# Patient Record
Sex: Female | Born: 1975 | Race: White | Hispanic: No | Marital: Married | Smoking: Never smoker
Health system: Southern US, Community
[De-identification: ages and names within clinical notes are randomized; demographics above are authoritative.]

## PROBLEM LIST (undated history)

## (undated) DIAGNOSIS — C73 Malignant neoplasm of thyroid gland: Secondary | ICD-10-CM

## (undated) DIAGNOSIS — I10 Essential (primary) hypertension: Secondary | ICD-10-CM

## (undated) DIAGNOSIS — J302 Other seasonal allergic rhinitis: Secondary | ICD-10-CM

## (undated) DIAGNOSIS — I2699 Other pulmonary embolism without acute cor pulmonale: Secondary | ICD-10-CM

## (undated) DIAGNOSIS — I82409 Acute embolism and thrombosis of unspecified deep veins of unspecified lower extremity: Secondary | ICD-10-CM

## (undated) DIAGNOSIS — F411 Generalized anxiety disorder: Secondary | ICD-10-CM

## (undated) DIAGNOSIS — D689 Coagulation defect, unspecified: Secondary | ICD-10-CM

## (undated) DIAGNOSIS — K219 Gastro-esophageal reflux disease without esophagitis: Secondary | ICD-10-CM

## (undated) DIAGNOSIS — R011 Cardiac murmur, unspecified: Secondary | ICD-10-CM

## (undated) HISTORY — PX: CHOLECYSTECTOMY: SHX55

## (undated) HISTORY — DX: Cardiac murmur, unspecified: R01.1

## (undated) HISTORY — DX: Coagulation defect, unspecified: D68.9

## (undated) HISTORY — DX: Malignant neoplasm of thyroid gland: C73

## (undated) HISTORY — DX: Essential (primary) hypertension: I10

## (undated) HISTORY — PX: BIOPSY THYROID: PRO38

---
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
## 2012-05-05 DIAGNOSIS — I2699 Other pulmonary embolism without acute cor pulmonale: Secondary | ICD-10-CM

## 2012-05-05 HISTORY — DX: Other pulmonary embolism without acute cor pulmonale: I26.99

## 2012-11-05 ENCOUNTER — Emergency Department (HOSPITAL_COMMUNITY)
Admission: EM | Admit: 2012-11-05 | Discharge: 2012-11-05 | Disposition: A | Discharge status: Home / Self Care | Payer: 59 | Attending: Emergency Medicine | Admitting: Emergency Medicine

## 2012-11-05 ENCOUNTER — Encounter (HOSPITAL_COMMUNITY): Payer: Self-pay | Admitting: Emergency Medicine

## 2012-11-05 ENCOUNTER — Emergency Department (HOSPITAL_COMMUNITY): Payer: 59

## 2012-11-05 DIAGNOSIS — Y9289 Other specified places as the place of occurrence of the external cause: Secondary | ICD-10-CM | POA: Insufficient documentation

## 2012-11-05 DIAGNOSIS — Y9389 Activity, other specified: Secondary | ICD-10-CM | POA: Insufficient documentation

## 2012-11-05 DIAGNOSIS — R209 Unspecified disturbances of skin sensation: Secondary | ICD-10-CM | POA: Insufficient documentation

## 2012-11-05 DIAGNOSIS — Z880 Allergy status to penicillin: Secondary | ICD-10-CM | POA: Insufficient documentation

## 2012-11-05 DIAGNOSIS — X500XXA Overexertion from strenuous movement or load, initial encounter: Secondary | ICD-10-CM | POA: Insufficient documentation

## 2012-11-05 DIAGNOSIS — S93409A Sprain of unspecified ligament of unspecified ankle, initial encounter: Secondary | ICD-10-CM | POA: Insufficient documentation

## 2012-11-05 MED ORDER — HYDROCODONE-ACETAMINOPHEN 5-325 MG PO TABS: 1.0000 | ORAL_TABLET | ORAL | Status: DC | PRN

## 2012-11-05 NOTE — ED Provider Notes (Signed)
Medical screening examination/treatment/procedure(s) were performed by non-physician practitioner and as supervising physician I was immediately available for consultation/collaboration.  [name], MD 11/05/12 [phone]

## 2012-11-05 NOTE — ED Notes (Signed)
Pt c/o ankle pain. Pt rolled ankle in a hole. Pain 5/10

## 2012-11-05 NOTE — ED Provider Notes (Signed)
History  This chart was scribed for non-physician practitioner working with Amy Chick, MD by Amy Mccall, ED scribe. This patient was seen in room WTR2/[facility]PT2 and the patient's care was started at 6:07 PM.  CSN: [id number] Arrival date & time 11/05/12  1731   Chief Complaint  Patient presents with  . Ankle Pain   The history is provided by the patient. No language interpreter was used.    HPI Comments: Amy Mccall is a 37 y.o. female who presents to the Emergency Department complaining of left ankle pain that started one hour ago. Pt states she stepped in a hole and twisted her ankle. She rates her pain 5/10. Pt states she is not able to ambulate. Pt denies hitting her head or LOC. Pt states she has some numbness in her little toes. She states she does not need pain medication right now.  History reviewed. No pertinent past medical history. History reviewed. No pertinent past surgical history. No family history on file. History  Substance Use Topics  . Smoking status: Never Smoker   . Smokeless tobacco: Not on file  . Alcohol Use: No   OB History   Grav Para Term Preterm Abortions TAB SAB Ect Mult Living                 Review of Systems  Musculoskeletal: Positive for arthralgias.  Neurological: Negative for syncope.  All other systems reviewed and are negative.    Allergies  Sulfa antibiotics and Penicillins  Home Medications   Current Outpatient Rx  Name  Route  Sig  Dispense  Refill  . cetirizine (ZYRTEC) 10 MG tablet   Oral   Take 10 mg by mouth daily as needed for allergies.         Marland Kitchen HYDROcodone-acetaminophen (NORCO/VICODIN) 5-325 MG per tablet   Oral   Take 1 tablet by mouth every 4 (four) hours as needed for pain.   15 tablet   0   . ibuprofen (ADVIL,MOTRIN) 200 MG tablet   Oral   Take 600 mg by mouth every 8 (eight) hours as needed for pain.         Marland Kitchen levonorgestrel-ethinyl estradiol (NORDETTE) 0.15-30 MG-MCG tablet   Oral   Take 1 tablet  by mouth daily.         Marland Kitchen triamcinolone (NASACORT) 55 MCG/ACT nasal inhaler   Nasal   Place 2 sprays into the nose daily.           BP 132/83  Pulse 84  Temp(Src) 98.6 F (37 C) (Oral)  Resp 18  Ht 5\' 6"  (1.676 m)  Wt 205 lb (92.987 kg)  BMI 33.1 kg/m2  SpO2 100%  LMP 10/11/2012  Physical Exam  Nursing note and vitals reviewed. Constitutional: She is oriented to person, place, and time. She appears well-developed and well-nourished. No distress.  HENT:  Head: Normocephalic and atraumatic.  Eyes: EOM are normal.  Neck: Neck supple. No tracheal deviation present.  Cardiovascular: Normal rate.   Pulmonary/Chest: Effort normal. No respiratory distress.  Musculoskeletal:       Left ankle: She exhibits decreased range of motion (due to pain), swelling and ecchymosis. She exhibits no deformity, no laceration and normal pulse. Tenderness. Lateral malleolus tenderness found. Achilles tendon normal.  Neurological: She is alert and oriented to person, place, and time.  Skin: Skin is warm and dry.  Psychiatric: She has a normal mood and affect. Her behavior is normal.    ED Course  Procedures (including  critical care time)  DIAGNOSTIC STUDIES: Oxygen Saturation is 100% on RA, normal by my interpretation.    COORDINATION OF CARE: 6:28 PM-Discussed treatment plan which includes xray with pt at bedside and pt agreed to plan. Advised pt to take ibuprofen at home for pain.  Labs Reviewed - No data to display Dg Ankle Complete Left  11/05/2012   *RADIOLOGY REPORT*  Clinical Data: Twisted left ankle, left ankle pain, injury  LEFT ANKLE COMPLETE - 3+ VIEW  Comparison: None  Findings: Soft tissue swelling laterally. Osseous mineralization normal. Ankle mortise intact. No acute fracture, dislocation or bone destruction.  IMPRESSION: No acute osseous abnormalities.   Original Report Authenticated By: Amy Mccall, M.D.   1. Ankle sprain and strain, left, initial encounter     MDM   Crutches,  ASO, ortho f/u. Rx pain medication for break through pain. Advised to follow R.I.C.E and use Ibuprofen for pain.    37 y.o.Amy Mccall's evaluation in the Emergency Department is complete. It has been determined that no acute conditions requiring further emergency intervention are present at this time. The patient/guardian have been advised of the diagnosis and plan. We have discussed signs and symptoms that warrant return to the ED, such as changes or worsening in symptoms.  Vital signs are stable at discharge. Filed Vitals:   11/05/12 1809  BP: 132/83  Pulse: 84  Temp: 98.6 F (37 C)  Resp: 18    Patient/guardian has voiced understanding and agreed to follow-up with the PCP or specialist.   I personally performed the services described in this documentation, which was scribed in my presence. The recorded information has been reviewed and is accurate.   Amy Matas, PA-C 11/05/12 1845

## 2012-12-09 ENCOUNTER — Emergency Department (HOSPITAL_BASED_OUTPATIENT_CLINIC_OR_DEPARTMENT_OTHER)
Admission: EM | Admit: 2012-12-09 | Discharge: 2012-12-09 | Disposition: A | Discharge status: Home / Self Care | Payer: 59 | Attending: Emergency Medicine | Admitting: Emergency Medicine

## 2012-12-09 ENCOUNTER — Encounter (HOSPITAL_BASED_OUTPATIENT_CLINIC_OR_DEPARTMENT_OTHER): Payer: Self-pay | Admitting: *Deleted

## 2012-12-09 DIAGNOSIS — M7989 Other specified soft tissue disorders: Secondary | ICD-10-CM | POA: Insufficient documentation

## 2012-12-09 DIAGNOSIS — I82402 Acute embolism and thrombosis of unspecified deep veins of left lower extremity: Secondary | ICD-10-CM

## 2012-12-09 DIAGNOSIS — Z79899 Other long term (current) drug therapy: Secondary | ICD-10-CM | POA: Insufficient documentation

## 2012-12-09 DIAGNOSIS — I82409 Acute embolism and thrombosis of unspecified deep veins of unspecified lower extremity: Secondary | ICD-10-CM | POA: Insufficient documentation

## 2012-12-09 DIAGNOSIS — Z880 Allergy status to penicillin: Secondary | ICD-10-CM | POA: Insufficient documentation

## 2012-12-09 DIAGNOSIS — IMO0001 Reserved for inherently not codable concepts without codable children: Secondary | ICD-10-CM | POA: Insufficient documentation

## 2012-12-09 LAB — CBC WITH DIFFERENTIAL/PLATELET
Basophils Absolute: 0 10*3/uL (ref 0.0–0.1)
Basophils Relative: 0 % (ref 0–1)
Eosinophils Absolute: 0.1 10*3/uL (ref 0.0–0.7)
HCT: 39 % (ref 36.0–46.0)
Hemoglobin: 13.3 g/dL (ref 12.0–15.0)
MCH: 30 pg (ref 26.0–34.0)
MCHC: 34.1 g/dL (ref 30.0–36.0)
Monocytes Absolute: 1 10*3/uL (ref 0.1–1.0)
Monocytes Relative: 9 % (ref 3–12)
RDW: 12.4 % (ref 11.5–15.5)

## 2012-12-09 LAB — PROTIME-INR
INR: 1.02 (ref 0.00–1.49)
Prothrombin Time: 13.2 seconds (ref 11.6–15.2)

## 2012-12-09 LAB — COMPREHENSIVE METABOLIC PANEL
Albumin: 3.9 g/dL (ref 3.5–5.2)
BUN: 9 mg/dL (ref 6–23)
Calcium: 10.2 mg/dL (ref 8.4–10.5)
Creatinine, Ser: 0.8 mg/dL (ref 0.50–1.10)
Total Protein: 7.7 g/dL (ref 6.0–8.3)

## 2012-12-09 LAB — HCG, SERUM, QUALITATIVE: Preg, Serum: NEGATIVE

## 2012-12-09 LAB — APTT: aPTT: 29 seconds (ref 24–37)

## 2012-12-09 MED ORDER — ENOXAPARIN SODIUM 100 MG/ML ~~LOC~~ SOLN: 1.0000 mg/kg | Freq: Two times a day (BID) | SUBCUTANEOUS | Status: DC

## 2012-12-09 MED ORDER — WARFARIN SODIUM 5 MG PO TABS
5.0000 mg | ORAL_TABLET | Freq: Once | ORAL | Status: AC
  Administered 2012-12-09: 5 mg via ORAL
  Filled 2012-12-09: qty 1

## 2012-12-09 MED ORDER — ENOXAPARIN SODIUM 100 MG/ML ~~LOC~~ SOLN
1.0000 mg/kg | Freq: Once | SUBCUTANEOUS | Status: AC
  Administered 2012-12-09: 95 mg via SUBCUTANEOUS
  Filled 2012-12-09: qty 1

## 2012-12-09 MED ORDER — TRAMADOL HCL 50 MG PO TABS: 50.0000 mg | ORAL_TABLET | Freq: Four times a day (QID) | ORAL | Status: DC | PRN

## 2012-12-09 MED ORDER — WARFARIN SODIUM 5 MG PO TABS: 5.0000 mg | ORAL_TABLET | Freq: Every day | ORAL | Status: DC

## 2012-12-09 NOTE — ED Notes (Signed)
Pt w/left calf pain x 2 weeks seen by PMD US done today with DX DVT.

## 2012-12-09 NOTE — ED Notes (Signed)
MD at bedside. 

## 2012-12-09 NOTE — ED Provider Notes (Signed)
CSN: [id number]     Arrival date & time 12/09/12  1742 History     First MD Initiated Contact with Patient 12/09/12 1749     Chief Complaint  Patient presents with  . DVT   (Consider location/radiation/quality/duration/timing/severity/associated sxs/prior Treatment) HPI Pt sprained L ankle 4 weeks ago. She began to have L calf swelling, pain and discoloration 2 weeks ago. No chest pain SOB. Pt's father with history of DVT. Pt is also on oral birth control tabs. Pt had [place] LLE which showed acute DVT in L popliteal vein. Pt continues to deny SOB or chest pain.  History reviewed. No pertinent past medical history. History reviewed. No pertinent past surgical history. History reviewed. No pertinent family history. History  Substance Use Topics  . Smoking status: Never Smoker   . Smokeless tobacco: Not on file  . Alcohol Use: No   OB History   Grav Para Term Preterm Abortions TAB SAB Ect Mult Living                 Review of Systems  Constitutional: Negative for fever and chills.  HENT: Negative for neck pain.   Respiratory: Negative for shortness of breath.   Cardiovascular: Positive for leg swelling. Negative for chest pain and palpitations.  Gastrointestinal: Negative for nausea, vomiting, abdominal pain, diarrhea and constipation.  Musculoskeletal: Positive for myalgias. Negative for back pain.  Skin: Positive for color change. Negative for rash.  Neurological: Negative for dizziness, weakness, light-headedness, numbness and headaches.  All other systems reviewed and are negative.    Allergies  Sulfa antibiotics and Penicillins  Home Medications   Current Outpatient Rx  Name  Route  Sig  Dispense  Refill  . cetirizine (ZYRTEC) 10 MG tablet   Oral   Take 10 mg by mouth daily as needed for allergies.         Marland Kitchen enoxaparin (LOVENOX) 100 MG/ML injection   Subcutaneous   Inject 0.95 mLs (95 mg total) into the skin every 12 (twelve) hours.   20 mL   0   . ibuprofen  (ADVIL,MOTRIN) 200 MG tablet   Oral   Take 600 mg by mouth every 8 (eight) hours as needed for pain.         Marland Kitchen levonorgestrel-ethinyl estradiol (NORDETTE) 0.15-30 MG-MCG tablet   Oral   Take 1 tablet by mouth daily.         . traMADol (ULTRAM) 50 MG tablet   Oral   Take 1 tablet (50 mg total) by mouth every 6 (six) hours as needed for pain.   15 tablet   0   . triamcinolone (NASACORT) 55 MCG/ACT nasal inhaler   Nasal   Place 2 sprays into the nose daily.         Marland Kitchen warfarin (COUMADIN) 5 MG tablet   Oral   Take 1 tablet (5 mg total) by mouth daily.   5 tablet   0    BP 143/87  Pulse 88  Resp 20  Ht 5\' 6"  (1.676 m)  Wt 210 lb (95.255 kg)  BMI 33.91 kg/m2  SpO2 99%  LMP 12/02/2012 Physical Exam  Nursing note and vitals reviewed. Constitutional: She is oriented to person, place, and time. She appears well-developed and well-nourished. No distress.  HENT:  Head: Normocephalic and atraumatic.  Mouth/Throat: Oropharynx is clear and moist.  Eyes: EOM are normal. Pupils are equal, round, and reactive to light.  Neck: Normal range of motion. Neck supple.  Cardiovascular: Normal rate  and regular rhythm.   Pulmonary/Chest: Effort normal and breath sounds normal. No respiratory distress. She has no wheezes. She has no rales. She exhibits no tenderness.  Abdominal: Soft. Bowel sounds are normal. She exhibits no distension and no mass. There is no tenderness. There is no rebound and no guarding.  Musculoskeletal: Normal range of motion. She exhibits edema and tenderness.  Swelling below knee of LLE, with diffuse increased erythema and calf tenderness. 2+DP. Good cap refill  Neurological: She is alert and oriented to person, place, and time.  Sensation intact, 5/5 motor in all ext.   Skin: Skin is warm and dry. No rash noted. There is erythema.  Psychiatric: She has a normal mood and affect. Her behavior is normal.    ED Course   Procedures (including critical care  time)  Labs Reviewed  CBC WITH DIFFERENTIAL - Abnormal; Notable for the following:    WBC 11.1 (*)    Neutro Abs 7.8 (*)    All other components within normal limits  COMPREHENSIVE METABOLIC PANEL  PROTIME-INR  APTT  HCG, SERUM, QUALITATIVE   No results found. 1. Left leg DVT     MDM  Will check basic labs, start lovenox and coumdin and have pt f/u with PMD to check INR and make adjustments.   Loren Racer, MD 12/09/12 [phone]

## 2015-02-06 ENCOUNTER — Other Ambulatory Visit: Payer: Self-pay | Admitting: Otolaryngology

## 2015-02-06 DIAGNOSIS — E041 Nontoxic single thyroid nodule: Secondary | ICD-10-CM

## 2015-05-06 HISTORY — PX: THYROIDECTOMY: SHX17

## 2015-05-24 DIAGNOSIS — J301 Allergic rhinitis due to pollen: Secondary | ICD-10-CM | POA: Insufficient documentation

## 2015-05-24 DIAGNOSIS — F411 Generalized anxiety disorder: Secondary | ICD-10-CM | POA: Insufficient documentation

## 2015-10-28 ENCOUNTER — Emergency Department (HOSPITAL_BASED_OUTPATIENT_CLINIC_OR_DEPARTMENT_OTHER)
Admission: EM | Admit: 2015-10-28 | Discharge: 2015-10-28 | Disposition: A | Discharge status: Home / Self Care | Payer: 59 | Attending: Emergency Medicine | Admitting: Emergency Medicine

## 2015-10-28 ENCOUNTER — Emergency Department (HOSPITAL_BASED_OUTPATIENT_CLINIC_OR_DEPARTMENT_OTHER): Payer: 59

## 2015-10-28 ENCOUNTER — Encounter (HOSPITAL_BASED_OUTPATIENT_CLINIC_OR_DEPARTMENT_OTHER): Payer: Self-pay | Admitting: Emergency Medicine

## 2015-10-28 DIAGNOSIS — S99911A Unspecified injury of right ankle, initial encounter: Secondary | ICD-10-CM

## 2015-10-28 DIAGNOSIS — Z7982 Long term (current) use of aspirin: Secondary | ICD-10-CM | POA: Diagnosis not present

## 2015-10-28 DIAGNOSIS — Z79899 Other long term (current) drug therapy: Secondary | ICD-10-CM | POA: Insufficient documentation

## 2015-10-28 DIAGNOSIS — Y9301 Activity, walking, marching and hiking: Secondary | ICD-10-CM | POA: Diagnosis not present

## 2015-10-28 DIAGNOSIS — Y999 Unspecified external cause status: Secondary | ICD-10-CM | POA: Insufficient documentation

## 2015-10-28 DIAGNOSIS — W010XXA Fall on same level from slipping, tripping and stumbling without subsequent striking against object, initial encounter: Secondary | ICD-10-CM | POA: Insufficient documentation

## 2015-10-28 DIAGNOSIS — S8992XA Unspecified injury of left lower leg, initial encounter: Secondary | ICD-10-CM | POA: Diagnosis present

## 2015-10-28 DIAGNOSIS — Y929 Unspecified place or not applicable: Secondary | ICD-10-CM | POA: Insufficient documentation

## 2015-10-28 DIAGNOSIS — S80212A Abrasion, left knee, initial encounter: Secondary | ICD-10-CM | POA: Diagnosis not present

## 2015-10-28 HISTORY — DX: Generalized anxiety disorder: F41.1

## 2015-10-28 HISTORY — DX: Gastro-esophageal reflux disease without esophagitis: K21.9

## 2015-10-28 HISTORY — DX: Other pulmonary embolism without acute cor pulmonale: I26.99

## 2015-10-28 HISTORY — DX: Other seasonal allergic rhinitis: J30.2

## 2015-10-28 HISTORY — DX: Acute embolism and thrombosis of unspecified deep veins of unspecified lower extremity: I82.409

## 2015-10-28 MED ORDER — IBUPROFEN 400 MG PO TABS
600.0000 mg | ORAL_TABLET | Freq: Once | ORAL | Status: AC
  Administered 2015-10-28: 600 mg via ORAL
  Filled 2015-10-28: qty 1

## 2015-10-28 NOTE — ED Notes (Signed)
PA at bedside.

## 2015-10-28 NOTE — ED Notes (Signed)
Pt reports slipping on wet grass around 10 this morning and turning her ankle. Pt c/o R ankle pain. Swelling noted. Pt denies LOC. Pt also has abrasion noted to L knee.

## 2015-10-28 NOTE — ED Provider Notes (Signed)
CSN: [id number]     Arrival date & time 10/28/15  1016 History   First MD Initiated Contact with Patient 10/28/15 1045     Chief Complaint  Patient presents with  . Ankle Injury     (Consider location/radiation/quality/duration/timing/severity/associated sxs/prior Treatment) HPI   Patient is a 40 year old female with past medical history of anxiety, DVT/PE who presents to the ED status post fall is reported right ankle pain, onset 10 AM. Patient reports she was walking at her son's baseball game when she slipped on wet grass resulting in her inverting her right ankle. Patient reports having pain and swelling to the lateral aspect of her right ankle. Patient denies head injury or LOC. Denies numbness, tingling. Patient also reports having small abrasion to her left anterior knee, no active bleeding. Denies left knee pain. Pt denies taking any medications PTA. She reports she had a DVT and PE 3 years ago and notes she currently takes 81mg  ASA daily. Pt denies any blood disorders and reports her hematologist told her he suspected her DVT was likely due to her ankle brace being wrapped too tightly after her injury.   Past Medical History  Diagnosis Date  . DVT (deep venous thrombosis) ([place])   . PE (pulmonary thromboembolism) ([place])   . Multiple thyroid nodules   . GERD (gastroesophageal reflux disease)   . Generalized anxiety disorder   . Seasonal allergies    Past Surgical History  Procedure Laterality Date  . Cholecystectomy    . Biopsy thyroid     No family history on file. Social History  Substance Use Topics  . Smoking status: Never Smoker   . Smokeless tobacco: None  . Alcohol Use: No   OB History    No data available     Review of Systems  Respiratory: Negative for shortness of breath.   Cardiovascular: Negative for chest pain and leg swelling.  Musculoskeletal: Positive for joint swelling (right ankle) and arthralgias (right ankle).  Skin: Positive for wound (abrasion).    Neurological: Negative for weakness and numbness.      Allergies  Sulfa antibiotics and Penicillins  Home Medications   Prior to Admission medications   Medication Sig Start Date End Date Taking? Authorizing Provider  aspirin EC 81 MG tablet Take 81 mg by mouth daily.   Yes Historical Provider, MD  cholecalciferol (VITAMIN D) 1000 units tablet Take 1,000 Units by mouth daily.   Yes Historical Provider, MD  esomeprazole (NEXIUM) 20 MG capsule Take 20 mg by mouth daily at 12 noon.   Yes Historical Provider, MD  norethindrone (MICRONOR,CAMILA,ERRIN) 0.35 MG tablet Take 1 tablet by mouth daily.   Yes Historical Provider, MD  sertraline (ZOLOFT) 50 MG tablet Take 50 mg by mouth daily.   Yes Historical Provider, MD  cetirizine (ZYRTEC) 10 MG tablet Take 10 mg by mouth daily as needed for allergies.    Historical Provider, MD  triamcinolone (NASACORT) 55 MCG/ACT nasal inhaler Place 2 sprays into the nose daily.    Historical Provider, MD   BP 143/68 mmHg  Pulse 93  Temp(Src) 98.6 F (37 C) (Oral)  Resp 16  Ht 5\' 6"  (1.676 m)  Wt 99.791 kg  BMI 35.53 kg/m2  SpO2 100% Physical Exam  Constitutional: She is oriented to person, place, and time. She appears well-developed and well-nourished. No distress.  HENT:  Head: Normocephalic and atraumatic.  Eyes: Conjunctivae and EOM are normal. Right eye exhibits no discharge. Left eye exhibits no discharge. No scleral  icterus.  Neck: Normal range of motion. Neck supple.  Cardiovascular: Normal rate.   Pulmonary/Chest: Effort normal. No respiratory distress.  Musculoskeletal: Normal range of motion. She exhibits tenderness.       Left knee: She exhibits normal range of motion, no swelling, no effusion, no ecchymosis, no deformity, no laceration, no erythema, normal alignment, no LCL laxity, normal patellar mobility and no MCL laxity. No tenderness found.       Right ankle: She exhibits swelling. She exhibits normal range of motion, no ecchymosis,  no deformity, no laceration and normal pulse. Tenderness. Lateral malleolus tenderness found. Achilles tendon normal.       Legs:      Feet:  FROM of bilateral knees, ankles, feet and toes with 5 out of 5 strength. Sensation grossly intact. No calf pain or swelling. 2+ DP pulses.  Neurological: She is alert and oriented to person, place, and time.  Skin: She is not diaphoretic.  Nursing note and vitals reviewed.   ED Course  Procedures (including critical care time) Labs Review Labs Reviewed - No data to display  Imaging Review Dg Ankle Complete Right  10/28/2015  CLINICAL DATA:  Right ankle pain after injury EXAM: RIGHT ANKLE - COMPLETE 3+ VIEW COMPARISON:  None. FINDINGS: Lateral right ankle soft tissue swelling. No fracture, subluxation or suspicious focal osseous lesion. No appreciable degenerative or erosive arthropathy. No radiopaque foreign body. Tiny plantar right calcaneal spur. IMPRESSION: Lateral right ankle soft tissue swelling, with no fracture or subluxation. Electronically Signed   By: Ilona Sorrel M.D.   On: 10/28/2015 11:24   I have personally reviewed and evaluated these images and lab results as part of my medical decision-making.   EKG Interpretation None      MDM   Final diagnoses:  Ankle injury, right, initial encounter    Patient presents with right ankle pain and swelling after slipping and twisting her ankle earlier this morning. Denies head injury or LOC. History of DVT/PE status post ankle sprain 3 years ago, patient currently on baby aspirin daily, pt is followed by hematologist. VSS. Exam revealed mild swelling and tenderness to right lateral malleolus, remaining exam unremarkable. Bilateral lower extremities neurovascularly intact. No calf swelling, active cancer, recent bed rest/major surgery, leg swelling, pitting edema or tenderness over deep venous system; I do not suspect a DVT at this time and do not feel that any further work up or imaging is  warranted. Ankle xray revealed lateral right ankle soft tissue swelling with no fracture or subluxation. Discussed results and plan for discharge with patient. Patient placed in ankle brace and given crutches in the ED. Discussed symptomatic treatment including RICE protocol with pt. discussed strict return precautions with patient including leg swelling, calf swelling/pain, SOB or CP. Patient reports understanding and agreement. Advised patient to follow up with orthopedist in the next 1-2 weeks as needed.    Chesley Noon [place], [place] 10/28/15 [place], MD 10/28/15 1444

## 2015-10-28 NOTE — Discharge Instructions (Signed)
I recommend taking Tylenol as prescribed over-the-counter as needed for pain relief. I also recommend resting, elevating and applying ice to her right ankle for 15-20 minutes 3-4 times daily to help with pain and swelling. He may continue using your ankle brace over the next few weeks and usually her crutches over the next few days as needed. I recommend refraining from laying in bed all day to prevent DVTs. Follow-up with the orthopedist clinic listed above within the next 1-2 weeks if your symptoms have not improved. Please return to the Emergency Department if symptoms worsen or new onset of fever, redness, swelling, numbness, tingling, weakness, calf swelling, leg swelling, shortness of breath, chest pain.

## 2015-10-28 NOTE — ED Notes (Signed)
Pt fell and injured right ankle.  Abrasion to left knee.  Able to move toes.  Pt concerned about blood clots due to history of DVT and PE post sprain 3 years ago.

## 2016-05-30 DIAGNOSIS — E890 Postprocedural hypothyroidism: Secondary | ICD-10-CM | POA: Diagnosis not present

## 2016-05-30 DIAGNOSIS — Z85850 Personal history of malignant neoplasm of thyroid: Secondary | ICD-10-CM | POA: Diagnosis not present

## 2016-05-30 DIAGNOSIS — C73 Malignant neoplasm of thyroid gland: Secondary | ICD-10-CM | POA: Diagnosis not present

## 2016-07-17 DIAGNOSIS — Z01419 Encounter for gynecological examination (general) (routine) without abnormal findings: Secondary | ICD-10-CM | POA: Diagnosis not present

## 2016-08-04 DIAGNOSIS — Z1231 Encounter for screening mammogram for malignant neoplasm of breast: Secondary | ICD-10-CM | POA: Diagnosis not present

## 2016-10-13 DIAGNOSIS — D235 Other benign neoplasm of skin of trunk: Secondary | ICD-10-CM | POA: Diagnosis not present

## 2016-10-31 DIAGNOSIS — E041 Nontoxic single thyroid nodule: Secondary | ICD-10-CM | POA: Diagnosis not present

## 2016-10-31 DIAGNOSIS — E890 Postprocedural hypothyroidism: Secondary | ICD-10-CM | POA: Diagnosis not present

## 2016-10-31 DIAGNOSIS — C73 Malignant neoplasm of thyroid gland: Secondary | ICD-10-CM | POA: Diagnosis not present

## 2016-10-31 DIAGNOSIS — E063 Autoimmune thyroiditis: Secondary | ICD-10-CM | POA: Diagnosis not present

## 2016-10-31 DIAGNOSIS — Z08 Encounter for follow-up examination after completed treatment for malignant neoplasm: Secondary | ICD-10-CM | POA: Diagnosis not present

## 2016-11-04 ENCOUNTER — Telehealth: Payer: Self-pay

## 2016-11-04 NOTE — Telephone Encounter (Signed)
Pre Visit Call completed today

## 2016-11-06 ENCOUNTER — Ambulatory Visit (INDEPENDENT_AMBULATORY_CARE_PROVIDER_SITE_OTHER): Payer: 59 | Admitting: Family Medicine

## 2016-11-06 ENCOUNTER — Encounter: Payer: Self-pay | Admitting: Family Medicine

## 2016-11-06 VITALS — BP 122/72 | HR 94 | Temp 98.6°F | Ht 67.0 in | Wt 233.0 lb

## 2016-11-06 DIAGNOSIS — E669 Obesity, unspecified: Secondary | ICD-10-CM | POA: Diagnosis not present

## 2016-11-06 DIAGNOSIS — R011 Cardiac murmur, unspecified: Secondary | ICD-10-CM

## 2016-11-06 NOTE — Progress Notes (Signed)
Chief Complaint  Patient presents with  . Establish Care       New Patient Visit SUBJECTIVE: HPI: Amy Mccall is an 41 y.o.female who is being seen for establishing care.  The patient was previously seen at [facility].  The patient is mainly here to see if she would like to start coming here. She would like to review her medical history and discuss weight loss options. She has gained around 50 pounds over the past several years. She no she needs to get back in the gym including a diet. She is wondering if there is an ideal way to do the latter.  Allergies  Allergen Reactions  . 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 Penicillins     Childhood allergy   Past Medical History:  Diagnosis Date  . DVT (deep venous thrombosis) ([place]) 2014   turned into PE  . Generalized anxiety disorder    Follows with a Social worker  . GERD (gastroesophageal reflux disease)   . PE (pulmonary thromboembolism) ([place]) 2014  . Seasonal allergies   . Thyroid cancer [facility])    Removed thyroid in Aug, 2017, radiation in October   Past Surgical History:  Procedure Laterality Date  . BIOPSY THYROID    . CHOLECYSTECTOMY    . THYROIDECTOMY  2017   Social History   Social History  . Marital status: Married   Social History Main Topics  . Smoking status: Never Smoker  . Smokeless tobacco: Never Used  . Alcohol use No  . Drug use: No  . Sexual activity: No   Other Topics Concern  . Not on file   Social History Narrative  . No narrative on file   Family History  Problem Relation Age of Onset  . Osteoporosis Mother   . COPD Father   . Diabetes Father   . Heart disease Father   . Colon cancer Neg Hx     Current Outpatient Prescriptions:  .  aspirin EC 81 MG tablet, Take 81 mg by mouth daily., Disp: , Rfl:  .  cetirizine (ZYRTEC) 10 MG tablet, Take 10 mg by mouth daily as needed for allergies., Disp: , Rfl:  .  cholecalciferol (VITAMIN D) 1000 units tablet, Take 1,000 Units  by mouth daily., Disp: , Rfl:  .  esomeprazole (NEXIUM) 20 MG capsule, Take 20 mg by mouth daily at 12 noon., Disp: , Rfl:  .  levothyroxine (SYNTHROID, LEVOTHROID) 150 MCG tablet, Take 150 mcg by mouth daily before breakfast. , Disp: , Rfl:  .  norethindrone (MICRONOR,CAMILA,ERRIN) 0.35 MG tablet, Take 1 tablet by mouth daily., Disp: , Rfl:   No LMP recorded. Patient is not currently having periods (Reason: Oral contraceptives).  ROS Cardiovascular: Denies chest pain, +palpitations  Respiratory: Denies dyspnea   OBJECTIVE: BP 122/72 (BP Location: Left Arm, Patient Position: Sitting, Cuff Size: Large)   Pulse 94   Temp 98.6 F (37 C) (Oral)   Ht 5\' 7"  (1.702 m)   Wt 233 lb (105.7 kg)   SpO2 98%   BMI 36.49 kg/m   Constitutional: -  VS reviewed -  Well developed, well nourished, appears stated age -  No apparent distress  Psychiatric: -  Oriented to person, place, and time -  Memory intact -  Affect and mood normal -  Fluent conversation, good eye contact -  Judgment and insight age appropriate  Eye: -  Conjunctivae clear, no discharge -  Pupils symmetric, round, reactive to light  ENMT: -  Oral mucosa without lesions, tongue and uvula midline    Tonsils not enlarged, no erythema, no exudate, trachea midline    Pharynx moist, no lesions, no erythema  Neck: -  No gross swelling, no palpable masses -  Thyroid midline, not enlarged, mobile, no palpable masses  Cardiovascular: -  RRR, 2/6 SEM heard loudest at tricuspid listening post -  No bruits, no LE edema  Respiratory: -  Normal respiratory effort, no accessory muscle use, no retraction -  Breath sounds equal, no wheezes, no ronchi, no crackles  Skin: -  No significant lesion on inspection -  Warm and dry to palpation   ASSESSMENT/PLAN: Heart murmur - Plan: ECHOCARDIOGRAM COMPLETE  Obesity (BMI 30-39.9)  Patient instructed to sign release of records form from her previous PCP. Pt not having symptoms regarding her  heart murmur. We'll obtain an echocardiogram. She is working closely with her endocrinologist regarding her palpitations which may be due to Synthroid. I do not think these are related. Discussed intermittent fasting- recommended 16/8. Counseled on diet and exercise. Healthy diet handout given. Patient should return prn. The patient voiced understanding and agreement to the plan.  Greater than 30 minutes were spent face to face with the patient with greater than 50% of this time spent counseling on heart murmurs, echocardiogram, follow up for this, and intermittent fasting.    [place], DO 11/06/16  11:35 AM

## 2016-11-06 NOTE — Patient Instructions (Addendum)
Consider intermittent fasting. I would recommend going 16 hours without calories and getting your energy in an 8 hour window. You may be hungry in the early goings. This decreases with time.  I would do this 2-3 times per week. I do it every day, but I don't have great data for this.   If you do not hear anything about your echo in the next week, call our office and ask for an update.  Healthy Eating Plan Many factors influence your heart health, including eating and exercise habits. Heart (coronary) risk increases with abnormal blood fat (lipid) levels. Heart-healthy meal planning includes limiting unhealthy fats, increasing healthy fats, and making other small dietary changes. This includes maintaining a healthy body weight to help keep lipid levels within a normal range.  WHAT IS MY PLAN?  Your health care provider recommends that you:  Drink a glass of water before meals to help with satiety.  Eat slowly.  An alternative to the water is to add Metamucil. This will help with satiety as well. It does contain calories, unlike water.  WHAT TYPES OF FAT SHOULD I CHOOSE?  Choose healthy fats more often. Choose monounsaturated and polyunsaturated fats, such as olive oil and canola oil, flaxseeds, walnuts, almonds, and seeds.  Eat more omega-3 fats. Good choices include salmon, mackerel, sardines, tuna, flaxseed oil, and ground flaxseeds. Aim to eat fish at least two times each week.  Avoid foods with partially hydrogenated oils in them. These contain trans fats. Examples of foods that contain trans fats are stick margarine, some tub margarines, cookies, crackers, and other baked goods. If you are going to avoid a fat, this is the one to avoid!  WHAT GENERAL GUIDELINES DO I NEED TO FOLLOW?  Check food labels carefully to identify foods with trans fats. Avoid these types of options when possible.  Fill one half of your plate with vegetables and green salads. Eat 4-5 servings of vegetables per  day. A serving of vegetables equals 1 cup of raw leafy vegetables,  cup of raw or cooked cut-up vegetables, or  cup of vegetable juice.  Fill one fourth of your plate with whole grains. Look for the word "whole" as the first word in the ingredient list.  Fill one fourth of your plate with lean protein foods.  Eat 4-5 servings of fruit per day. A serving of fruit equals one medium whole fruit,  cup of dried fruit,  cup of fresh, frozen, or canned fruit. Try to avoid fruits in cups/syrups as the sugar content can be high.  Eat more foods that contain soluble fiber. Examples of foods that contain this type of fiber are apples, broccoli, carrots, beans, peas, and barley. Aim to get 20-30 g of fiber per day.  Eat more home-cooked food and less restaurant, buffet, and fast food.  Limit or avoid alcohol.  Limit foods that are high in starch and sugar.  Avoid fried foods when able.  Cook foods by using methods other than frying. Baking, boiling, grilling, and broiling are all great options. Other fat-reducing suggestions include: ? Removing the skin from poultry. ? Removing all visible fats from meats. ? Skimming the fat off of stews, soups, and gravies before serving them. ? Steaming vegetables in water or broth.  Lose weight if you are overweight. Losing just 5-10% of your initial body weight can help your overall health and prevent diseases such as diabetes and heart disease.  Increase your consumption of nuts, legumes, and seeds to 4-5  servings per week. One serving of dried beans or legumes equals  cup after being cooked, one serving of nuts equals 1 ounces, and one serving of seeds equals  ounce or 1 tablespoon.  WHAT ARE GOOD FOODS CAN I EAT? Grains Grainy breads (try to find bread that is 3 g of fiber per slice or greater), oatmeal, light popcorn. Whole-grain cereals. Rice and pasta, including brown rice and those that are made with whole wheat. Edamame pasta is a great  alternative to grain pasta. It has a higher protein content. Try to avoid significant consumption of white bread, sugary cereals, or pastries/baked goods.  Vegetables All vegetables. Cooked white potatoes do not count as vegetables.  Fruits All fruits, but limit pineapple and bananas as these fruits have a higher sugar content.  Meats and Other Protein Sources Lean, well-trimmed beef, veal, pork, and lamb. Chicken and [place] without skin. All fish and shellfish. Wild duck, rabbit, pheasant, and venison. Egg whites or low-cholesterol egg substitutes. Dried beans, peas, lentils, and tofu.Seeds and most nuts.  Dairy Low-fat or nonfat cheeses, including ricotta, string, and mozzarella. Skim or 1% milk that is liquid, powdered, or evaporated. Buttermilk that is made with low-fat milk. Nonfat or low-fat yogurt. Soy/Almond milk are good alternatives if you cannot handle dairy.  Beverages Water is the best for you. Sports drinks with less sugar are more desirable unless you are a highly active athlete.  Sweets and Desserts Sherbets and fruit ices. Honey, jam, marmalade, jelly, and syrups. Dark chocolate.  Eat all sweets and desserts in moderation.  Fats and Oils Nonhydrogenated (trans-free) margarines. Vegetable oils, including soybean, sesame, sunflower, olive, peanut, safflower, corn, canola, and cottonseed. Salad dressings or mayonnaise that are made with a vegetable oil. Limit added fats and oils that you use for cooking, baking, salads, and as spreads.  Other Cocoa powder. Coffee and tea. Most condiments.  The items listed above may not be a complete list of recommended foods or beverages. Contact your dietitian for more options.

## 2016-11-06 NOTE — Progress Notes (Signed)
Pre visit review using our clinic review tool, if applicable. No additional management support is needed unless otherwise documented below in the visit note. 

## 2016-11-12 ENCOUNTER — Telehealth: Payer: Self-pay | Admitting: *Deleted

## 2016-11-12 NOTE — Telephone Encounter (Signed)
Received Medical records from [facility], forwarded to provider/SLS 07/11

## 2016-11-20 ENCOUNTER — Other Ambulatory Visit (HOSPITAL_BASED_OUTPATIENT_CLINIC_OR_DEPARTMENT_OTHER): Payer: 59

## 2016-11-23 ENCOUNTER — Emergency Department (HOSPITAL_BASED_OUTPATIENT_CLINIC_OR_DEPARTMENT_OTHER)
Admission: EM | Admit: 2016-11-23 | Discharge: 2016-11-23 | Disposition: A | Discharge status: Home / Self Care | Payer: 59 | Attending: Emergency Medicine | Admitting: Emergency Medicine

## 2016-11-23 ENCOUNTER — Encounter (HOSPITAL_BASED_OUTPATIENT_CLINIC_OR_DEPARTMENT_OTHER): Payer: Self-pay | Admitting: Emergency Medicine

## 2016-11-23 ENCOUNTER — Emergency Department (HOSPITAL_BASED_OUTPATIENT_CLINIC_OR_DEPARTMENT_OTHER): Payer: 59

## 2016-11-23 DIAGNOSIS — R0602 Shortness of breath: Secondary | ICD-10-CM | POA: Diagnosis not present

## 2016-11-23 DIAGNOSIS — R079 Chest pain, unspecified: Secondary | ICD-10-CM | POA: Diagnosis not present

## 2016-11-23 DIAGNOSIS — Z85850 Personal history of malignant neoplasm of thyroid: Secondary | ICD-10-CM | POA: Insufficient documentation

## 2016-11-23 DIAGNOSIS — Z7982 Long term (current) use of aspirin: Secondary | ICD-10-CM | POA: Diagnosis not present

## 2016-11-23 DIAGNOSIS — Z79899 Other long term (current) drug therapy: Secondary | ICD-10-CM | POA: Insufficient documentation

## 2016-11-23 LAB — COMPREHENSIVE METABOLIC PANEL
ALK PHOS: 87 U/L (ref 38–126)
ALT: 25 U/L (ref 14–54)
ANION GAP: 8 (ref 5–15)
AST: 21 U/L (ref 15–41)
Albumin: 3.9 g/dL (ref 3.5–5.0)
BILIRUBIN TOTAL: 0.9 mg/dL (ref 0.3–1.2)
BUN: 9 mg/dL (ref 6–20)
CALCIUM: 9 mg/dL (ref 8.9–10.3)
CO2: 26 mmol/L (ref 22–32)
Chloride: 107 mmol/L (ref 101–111)
Creatinine, Ser: 0.81 mg/dL (ref 0.44–1.00)
GFR calc non Af Amer: >60 mL/min (ref >60)
Glucose, Bld: 106 mg/dL — ABNORMAL HIGH (ref 65–99)
Potassium: 3.9 mmol/L (ref 3.5–5.1)
SODIUM: 141 mmol/L (ref 135–145)
TOTAL PROTEIN: 7.2 g/dL (ref 6.5–8.1)

## 2016-11-23 LAB — CBC WITH DIFFERENTIAL/PLATELET
Basophils Absolute: 0.1 10*3/uL (ref 0.0–0.1)
Basophils Relative: 1 %
EOS ABS: 0.1 10*3/uL (ref 0.0–0.7)
EOS PCT: 1 %
HCT: 35.4 % — ABNORMAL LOW (ref 36.0–46.0)
Hemoglobin: 11.9 g/dL — ABNORMAL LOW (ref 12.0–15.0)
Lymphocytes Relative: 35 %
Lymphs Abs: 3.1 10*3/uL (ref 0.7–4.0)
MCH: 27.2 pg (ref 26.0–34.0)
MCHC: 33.6 g/dL (ref 30.0–36.0)
MCV: 81 fL (ref 78.0–100.0)
Monocytes Absolute: 0.9 10*3/uL (ref 0.1–1.0)
Monocytes Relative: 10 %
Neutro Abs: 4.7 10*3/uL (ref 1.7–7.7)
Neutrophils Relative %: 53 %
PLATELETS: 305 10*3/uL (ref 150–400)
RBC: 4.37 MIL/uL (ref 3.87–5.11)
RDW: 14.9 % (ref 11.5–15.5)
WBC: 8.8 10*3/uL (ref 4.0–10.5)

## 2016-11-23 LAB — TROPONIN I: Troponin I: <0.03 ng/mL (ref <0.03)

## 2016-11-23 LAB — D-DIMER, QUANTITATIVE (NOT AT ARMC)

## 2016-11-23 NOTE — ED Provider Notes (Signed)
[facility] Provider Note   CSN: [id number] Arrival date & time: 11/23/16  1837  By signing my name below, I, Mayer Masker, attest that this documentation has been prepared under the direction and in the presence of Quincy Carnes, PA-C. Electronically Signed: Mayer Masker, Scribe. 11/23/16. 9:07 PM.  History   Chief Complaint Chief Complaint  Patient presents with  . Chest Pain   The history is provided by the patient. No language interpreter was used.    HPI Comments: Amy Mccall is a 41 y.o. female with PMHx of heart murmur, PE, DVT, and thyroid cancer who presents to the Emergency Department complaining of waxing/waning, gradually worsening left-sided chest pain for 6 hours. She has associated palpitations (that began 8 hours ago) and difficulty breathing. She states her CP feels as if someone is pushing on her chest, and the pain is worse when she breathes deeply and improves when she adjusts her position. She states she currently does not have palpitations, but her chest feels sore. She denies fever and h/o IVDU. Pt reports having a PE that resulted from a DVT from spraining her ankle 3 years ago.  Not currently on anticoagulation, daily ASA only. Pt also reports thyroid cancer with visits to her endocrinologist 1 month ago where her lab results have been "off" and her Dr. prescribing new medication regimens. She thinks this may be related. She has an echocardiogram scheduled with cardiologist soon.  Past Medical History:  Diagnosis Date  . DVT (deep venous thrombosis) ([place]) 2014   turned into PE  . Generalized anxiety disorder    Follows with a Social worker  . GERD (gastroesophageal reflux disease)   . PE (pulmonary thromboembolism) ([place]) 2014  . Seasonal allergies   . Thyroid cancer ([place])    Removed thyroid in Aug, 2017, radiation in October    There are no active problems to display for this patient.   Past Surgical History:  Procedure Laterality Date  . BIOPSY  THYROID    . CHOLECYSTECTOMY    . THYROIDECTOMY  2017    OB History    No data available       Home Medications    Prior to Admission medications   Medication Sig Start Date End Date Taking? Authorizing Provider  aspirin EC 81 MG tablet Take 81 mg by mouth daily.   Yes [provider]  cetirizine (ZYRTEC) 10 MG tablet Take 10 mg by mouth daily as needed for allergies.   Yes [provider]  cholecalciferol (VITAMIN D) 1000 units tablet Take 1,000 Units by mouth daily.   Yes [provider]  esomeprazole (NEXIUM) 20 MG capsule Take 20 mg by mouth daily at 12 noon.   Yes [provider]  levothyroxine (SYNTHROID, LEVOTHROID) 150 MCG tablet Take 150 mcg by mouth daily before breakfast.    Yes [provider]  norethindrone (MICRONOR,CAMILA,ERRIN) 0.35 MG tablet Take 1 tablet by mouth daily.   Yes [provider]    Family History Family History  Problem Relation Age of Onset  . Osteoporosis Mother   . COPD Father   . Diabetes Father   . Heart disease Father   . Colon cancer Neg Hx     Social History Social History  Substance Use Topics  . Smoking status: Never Smoker  . Smokeless tobacco: Never Used  . Alcohol use No     Allergies   Sulfa antibiotics and Penicillins   Review of Systems Review of Systems  Constitutional: Negative  for fever.  Respiratory: Positive for shortness of breath.   Cardiovascular: Positive for chest pain and palpitations.  All other systems reviewed and are negative.    Physical Exam Updated Vital Signs BP 120/85   Pulse 85   Temp 99.5 F (37.5 C) (Oral)   Resp 16   Ht 5\' 7"  (1.702 m)   Wt 230 lb (104.3 kg)   SpO2 100%   BMI 36.02 kg/m   Physical Exam  Constitutional: She is oriented to person, place, and time. She appears well-developed and well-nourished.  HENT:  Head: Normocephalic and atraumatic.  Mouth/Throat: Oropharynx is clear and moist.  Eyes: Pupils are equal,  round, and reactive to light. Conjunctivae and EOM are normal.  Neck: Normal range of motion.  Cardiovascular: Normal rate and regular rhythm.   Murmur heard.  Systolic murmur is present  HR variable between 86-97 during exam, murmur heard  Pulmonary/Chest: Effort normal and breath sounds normal.  Chest wall is nontender, lungs are clear, no respiratory distress noted  Abdominal: Soft. Bowel sounds are normal.  Musculoskeletal: Normal range of motion.  No significant peripheral edema, no calf swelling or tenderness, no palpable cords  Neurological: She is alert and oriented to person, place, and time.  Skin: Skin is warm and dry.  Psychiatric: She has a normal mood and affect.  Nursing note and vitals reviewed.    ED Treatments / Results  DIAGNOSTIC STUDIES: Oxygen Saturation is 100% on RA, normal by my interpretation.    COORDINATION OF CARE: 9:07 PM Discussed treatment plan with pt at bedside and pt agreed to plan.  Labs (all labs ordered are listed, but only abnormal results are displayed) Labs Reviewed  CBC WITH DIFFERENTIAL/PLATELET - Abnormal; Notable for the following:       Result Value   Hemoglobin 11.9 (*)    HCT 35.4 (*)    All other components within normal limits  COMPREHENSIVE METABOLIC PANEL - Abnormal; Notable for the following:    Glucose, Bld 106 (*)    All other components within normal limits  TROPONIN I  D-DIMER, QUANTITATIVE (NOT AT [facility])  TSH  T3, FREE    EKG  EKG Interpretation None       Radiology Dg Chest 2 View  Result Date: 11/23/2016 CLINICAL DATA:  Chest pain, onset today.  Shortness of breath. EXAM: CHEST  2 VIEW COMPARISON:  None. FINDINGS: The cardiomediastinal contours are normal. The lungs are clear. Pulmonary vasculature is normal. No consolidation, pleural effusion, or pneumothorax. No acute osseous abnormalities are seen. IMPRESSION: No acute pulmonary process. Electronically Signed   By: Jeb Levering M.D.   On: 11/23/2016  20:56    Procedures Procedures (including critical care time)  Medications Ordered in ED Medications - No data to display   Initial Impression / Assessment and Plan / ED Course  I have reviewed the triage vital signs and the nursing notes.  Pertinent labs & imaging results that were available during my care of the patient were reviewed by me and considered in my medical decision making (see chart for details).  41 year old female here with chest pain. She reports episodes of palpitations over the past month and a half. Has been working with her endocrinologist closely to adjust her thyroid medications.  EKG here sinus rhythm without acute ischemic changes. Her lab work including troponin and d-dimer are reassuring. Her chest x-ray is clear. She does not appear fluid overloaded. She does have a systolic murmur heard on exam, her PCP  discover this recently she has been set up for an echocardiogram to be performed later this week. Patient's symptoms are somewhat atypical. I do not suspect acute ACS, dissection, PE, other acute cardiac event. Her palpitations may be related to her thyroid. Repeat thyroid studies were sent today at patient's request, the should results sometime tomorrow. She will have her endocrinologist follow-up on these. Discussed continued caffeine modification, diet control, etc. at home. She will follow-up closely with her primary care doctor. Patient presents with worsening symptoms.  Final Clinical Impressions(s) / ED Diagnoses   Final diagnoses:  Chest pain, unspecified type    New Prescriptions Discharge Medication List as of 11/23/2016  9:41 PM     I personally performed the services described in this documentation, which was scribed in my presence. The recorded information has been reviewed and is accurate.   Larene Pickett, PA-C 11/23/16 2236    Davonna Belling, MD 11/23/16 3832    Davonna Belling, MD 11/23/16 [id number]

## 2016-11-23 NOTE — Discharge Instructions (Signed)
As we discussed, your cardiac testing today was normal. Your thyroid studies should result sometime tomorrow, have your endocrinologist follow-up on these. Make sure to have your echo done. Follow-up with your primary care doctor. Return here for any new/worsening symptoms.

## 2016-11-23 NOTE — ED Triage Notes (Addendum)
Chest pain with SOB since 3:00. Pain is non radiating. Pt reports she has been experiencing palpitations for the past month. Pt is scheduled for an echo this week.

## 2016-11-23 NOTE — ED Notes (Signed)
Patient is sitting with family texting on her phone NAD.

## 2016-11-24 LAB — TSH: TSH: 0.048 u[IU]/mL — ABNORMAL LOW (ref 0.350–4.500)

## 2016-11-25 ENCOUNTER — Telehealth: Payer: Self-pay | Admitting: Family Medicine

## 2016-11-25 LAB — T3, FREE: T3, Free: 3.3 pg/mL (ref 2.0–4.4)

## 2016-11-25 NOTE — Telephone Encounter (Signed)
Pt says that she was seen in the ED this weekend for SOB and chest discomfort. Pt says that PCP has already placed chest x-ray orders. She would like to be advised. Pt said that she was told to schedule a F/U apt with PCP at ED. Pt need to know if she should come in to see provider after x-ray or before?    CB: [phone]

## 2016-11-25 NOTE — Telephone Encounter (Signed)
...   Also, made pt aware that PCP is out of the office and may not receive a call back until tomorrow with advise. Pt express understanding. She is okay with that.

## 2016-11-26 ENCOUNTER — Ambulatory Visit (HOSPITAL_BASED_OUTPATIENT_CLINIC_OR_DEPARTMENT_OTHER)
Admission: RE | Admit: 2016-11-26 | Discharge: 2016-11-26 | Disposition: A | Discharge status: Home / Self Care | Payer: 59 | Source: Ambulatory Visit | Attending: Family Medicine | Admitting: Family Medicine

## 2016-11-26 DIAGNOSIS — I081 Rheumatic disorders of both mitral and tricuspid valves: Secondary | ICD-10-CM | POA: Diagnosis not present

## 2016-11-26 DIAGNOSIS — R011 Cardiac murmur, unspecified: Secondary | ICD-10-CM | POA: Diagnosis not present

## 2016-11-26 NOTE — Telephone Encounter (Signed)
I ordered an echo, not a chest x-ray. I would say come after the echo, before any chest x-ray since I did not order it and do not see a future order. TY.

## 2016-11-26 NOTE — Progress Notes (Signed)
  Echocardiogram 2D Echocardiogram has been performed.  Jennette Dubin 11/26/2016, 9:14 AM

## 2016-11-26 NOTE — Telephone Encounter (Signed)
Called and spoke with the pt and informed her of the message below.  Pt verbalized understanding and stated that she received the MyChart message from Dr. Nani Ravens regarding the results.   Pt stated that she feels she does not need to come in to be seen.  Inform the pt if she would like she can send Dr. Nani Ravens a MyChart message to see if he still feels she needs to come in.  Pt agreed.//AB/CMA

## 2016-11-27 ENCOUNTER — Encounter: Payer: Self-pay | Admitting: Family Medicine

## 2016-11-29 ENCOUNTER — Encounter (HOSPITAL_BASED_OUTPATIENT_CLINIC_OR_DEPARTMENT_OTHER): Payer: Self-pay | Admitting: Emergency Medicine

## 2016-11-29 ENCOUNTER — Emergency Department (HOSPITAL_BASED_OUTPATIENT_CLINIC_OR_DEPARTMENT_OTHER)
Admission: EM | Admit: 2016-11-29 | Discharge: 2016-11-29 | Disposition: A | Discharge status: Home / Self Care | Payer: 59 | Attending: Emergency Medicine | Admitting: Emergency Medicine

## 2016-11-29 DIAGNOSIS — M25512 Pain in left shoulder: Secondary | ICD-10-CM | POA: Diagnosis not present

## 2016-11-29 DIAGNOSIS — M25562 Pain in left knee: Secondary | ICD-10-CM | POA: Diagnosis not present

## 2016-11-29 DIAGNOSIS — Y999 Unspecified external cause status: Secondary | ICD-10-CM | POA: Diagnosis not present

## 2016-11-29 DIAGNOSIS — Y929 Unspecified place or not applicable: Secondary | ICD-10-CM | POA: Insufficient documentation

## 2016-11-29 DIAGNOSIS — Y939 Activity, unspecified: Secondary | ICD-10-CM | POA: Diagnosis not present

## 2016-11-29 MED ORDER — IBUPROFEN 600 MG PO TABS: 600.0000 mg | ORAL_TABLET | Freq: Four times a day (QID) | ORAL | 0 refills | Status: DC | PRN

## 2016-11-29 MED ORDER — METHOCARBAMOL 500 MG PO TABS: 500.0000 mg | ORAL_TABLET | Freq: Two times a day (BID) | ORAL | 0 refills | Status: DC

## 2016-11-29 MED ORDER — METHOCARBAMOL 500 MG PO TABS
500.0000 mg | ORAL_TABLET | Freq: Once | ORAL | Status: AC
  Administered 2016-11-29: 500 mg via ORAL
  Filled 2016-11-29: qty 1

## 2016-11-29 MED ORDER — IBUPROFEN 400 MG PO TABS
600.0000 mg | ORAL_TABLET | Freq: Once | ORAL | Status: AC
  Administered 2016-11-29: 600 mg via ORAL
  Filled 2016-11-29: qty 1

## 2016-11-29 NOTE — ED Notes (Signed)
Alert, NAD, calm, interactive, resps e/u, speaking in clear complete sentences, no dyspnea noted, skin W&D. Family at [facility]. Steady gait to b/r.
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
## 2016-11-29 NOTE — ED Notes (Signed)
EDPA into room, prior to RN assessment, see PA notes, pending orders.   
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
## 2016-12-23 ENCOUNTER — Emergency Department (HOSPITAL_BASED_OUTPATIENT_CLINIC_OR_DEPARTMENT_OTHER)
Admission: EM | Admit: 2016-12-23 | Discharge: 2016-12-24 | Disposition: A | Discharge status: Home / Self Care | Payer: 59 | Attending: Emergency Medicine | Admitting: Emergency Medicine

## 2016-12-23 ENCOUNTER — Encounter (HOSPITAL_BASED_OUTPATIENT_CLINIC_OR_DEPARTMENT_OTHER): Payer: Self-pay | Admitting: *Deleted

## 2016-12-23 ENCOUNTER — Emergency Department (HOSPITAL_BASED_OUTPATIENT_CLINIC_OR_DEPARTMENT_OTHER): Payer: 59

## 2016-12-23 DIAGNOSIS — Z23 Encounter for immunization: Secondary | ICD-10-CM | POA: Diagnosis not present

## 2016-12-23 DIAGNOSIS — S90851A Superficial foreign body, right foot, initial encounter: Secondary | ICD-10-CM | POA: Diagnosis not present

## 2016-12-23 DIAGNOSIS — W458XXA Other foreign body or object entering through skin, initial encounter: Secondary | ICD-10-CM | POA: Diagnosis not present

## 2016-12-23 DIAGNOSIS — Y998 Other external cause status: Secondary | ICD-10-CM | POA: Insufficient documentation

## 2016-12-23 DIAGNOSIS — Z79899 Other long term (current) drug therapy: Secondary | ICD-10-CM | POA: Diagnosis not present

## 2016-12-23 DIAGNOSIS — Z7982 Long term (current) use of aspirin: Secondary | ICD-10-CM | POA: Diagnosis not present

## 2016-12-23 DIAGNOSIS — Y9301 Activity, walking, marching and hiking: Secondary | ICD-10-CM | POA: Insufficient documentation

## 2016-12-23 DIAGNOSIS — Y929 Unspecified place or not applicable: Secondary | ICD-10-CM | POA: Insufficient documentation

## 2016-12-23 DIAGNOSIS — S91341A Puncture wound with foreign body, right foot, initial encounter: Secondary | ICD-10-CM | POA: Diagnosis not present

## 2016-12-23 DIAGNOSIS — S99921A Unspecified injury of right foot, initial encounter: Secondary | ICD-10-CM | POA: Diagnosis not present

## 2016-12-23 MED ORDER — LIDOCAINE HCL 1 % IJ SOLN
INTRAMUSCULAR | Status: AC
  Administered 2016-12-23: 10 mL
  Filled 2016-12-23: qty 10

## 2016-12-23 MED ORDER — TETANUS-DIPHTH-ACELL PERTUSSIS 5-2.5-18.5 LF-MCG/0.5 IM SUSP
0.5000 mL | Freq: Once | INTRAMUSCULAR | Status: AC
  Administered 2016-12-23: 0.5 mL via INTRAMUSCULAR
  Filled 2016-12-23: qty 0.5

## 2016-12-23 MED ORDER — LIDOCAINE HCL (PF) 1 % IJ SOLN: 10.0000 mL | Freq: Once | INTRAMUSCULAR | Status: DC

## 2016-12-23 NOTE — ED Provider Notes (Signed)
[facility] DEPT [facility] Provider Note   CSN: [id number] Arrival date & time: 12/23/16  2005     History   Chief Complaint Chief Complaint  Patient presents with  . Foreign Body    HPI Amy Mccall is a 41 y.o. female who presents to the emergency department today for foreign body in right foot. The patient states that she stepped on a toothpick around 6:30 PM tonight. She tried to remove the toothpick but it broke while trying to remove it and she was unable to remove the toothpick completely. She states there is a 1 inch area of the toothpick that is still a retained portion in the bottom of her right foot (closer to the heel). She states that this is tender with palpation and when walking. She has not taken anything for pain. She denies numbness, tingling, weakness, decreased range of motion. Patient is unsure Tetanus status.   HPI  Past Medical History:  Diagnosis Date  . DVT (deep venous thrombosis) ([place]) 2014   turned into PE  . Generalized anxiety disorder    Follows with a Social worker  . GERD (gastroesophageal reflux disease)   . PE (pulmonary thromboembolism) ([place]) 2014  . Seasonal allergies   . Thyroid cancer ([place])    Removed thyroid in Aug, 2017, radiation in October    There are no active problems to display for this patient.   Past Surgical History:  Procedure Laterality Date  . BIOPSY THYROID    . CHOLECYSTECTOMY    . THYROIDECTOMY  2017    OB History    No data available       Home Medications    Prior to Admission medications   Medication Sig Start Date End Date Taking? Authorizing Provider  aspirin EC 81 MG tablet Take 81 mg by mouth daily.    [provider]  cetirizine (ZYRTEC) 10 MG tablet Take 10 mg by mouth daily as needed for allergies.    [provider]  cholecalciferol (VITAMIN D) 1000 units tablet Take 1,000 Units by mouth daily.    [provider]  doxycycline (VIBRAMYCIN) 100 MG capsule Take 1 capsule (100 mg  total) by mouth 2 (two) times daily. 12/24/16   Marsella Suman, Barth Kirks, PA-C  esomeprazole (NEXIUM) 20 MG capsule Take 20 mg by mouth daily at 12 noon.    [provider]  ibuprofen (ADVIL,MOTRIN) 600 MG tablet Take 1 tablet (600 mg total) by mouth every 6 (six) hours as needed. 11/29/16   Shary Decamp, PA-C  levothyroxine (SYNTHROID, LEVOTHROID) 150 MCG tablet Take 150 mcg by mouth daily before breakfast.     [provider]  methocarbamol (ROBAXIN) 500 MG tablet Take 1 tablet (500 mg total) by mouth 2 (two) times daily. 11/29/16   Shary Decamp, PA-C  norethindrone (MICRONOR,CAMILA,ERRIN) 0.35 MG tablet Take 1 tablet by mouth daily.    [provider]    Family History Family History  Problem Relation Age of Onset  . Osteoporosis Mother   . COPD Father   . Diabetes Father   . Heart disease Father   . Colon cancer Neg Hx     Social History Social History  Substance Use Topics  . Smoking status: Never Smoker  . Smokeless tobacco: Never Used  . Alcohol use No     Allergies   Sulfa antibiotics and Penicillins   Review of Systems Review of Systems  Constitutional: Negative for chills and fever.  Musculoskeletal: Negative for arthralgias.  Skin: Positive for  wound.  Neurological: Negative for weakness and numbness.     Physical Exam Updated Vital Signs BP (!) 139/95   Pulse 99   Temp 98.2 F (36.8 C)   Resp 16   Ht 5\' 7"  (1.702 m)   Wt 104.3 kg (230 lb)   SpO2 100%   BMI 36.02 kg/m   Physical Exam  Constitutional: She appears well-developed and well-nourished.  HENT:  Head: Normocephalic and atraumatic.  Right Ear: External ear normal.  Left Ear: External ear normal.  Eyes: Conjunctivae are normal. Right eye exhibits no discharge. Left eye exhibits no discharge. No scleral icterus.  Cardiovascular:  Pulses:      Dorsalis pedis pulses are 2+ on the right side, and 2+ on the left side.       Posterior tibial pulses are 2+ on the right side, and  2+ on the left side.  Pulmonary/Chest: Effort normal. No respiratory distress.  Musculoskeletal:       Right ankle: Normal. Achilles tendon normal.       Right foot: There is normal range of motion, no swelling, normal capillary refill and no crepitus.  Right foot: At the heel there is a 1cm area of tracking from where the toothpick appears to have entered. No obvious palpation of FB. TTP over the area. Sensation grossly intact. Strength equal and appropriate b/l. Gait intact.   Neurological: She is alert. She has normal strength. No sensory deficit.  Skin: No pallor.  Psychiatric: She has a normal mood and affect.  Nursing note and vitals reviewed.    ED Treatments / Results  Labs (all labs ordered are listed, but only abnormal results are displayed) Labs Reviewed - No data to display  EKG  EKG Interpretation None       Radiology Dg Foot Complete Right  Result Date: 12/23/2016 CLINICAL DATA:  Stepped on toothpick EXAM: RIGHT FOOT COMPLETE - 3+ VIEW COMPARISON:  None. FINDINGS: Frontal, oblique, and lateral views obtained. No fracture or dislocation. Joint spaces appear normal. There is a small inferior calcaneal spur. There is no evident soft tissue air. No radiopaque foreign body evident. IMPRESSION: No soft tissue air or radiopaque foreign body appreciable. No fracture or dislocation. No appreciable arthropathy. Small inferior calcaneal spur. Electronically Signed   By: Lowella Grip III M.D.   On: 12/23/2016 20:40    Procedures .Foreign Body Removal Date/Time: 12/24/2016 11:10 AM Performed by: Jillyn Ledger Authorized by: Jillyn Ledger  Consent: Verbal consent obtained. Risks and benefits: risks, benefits and alternatives were discussed Consent given by: patient Patient understanding: patient states understanding of the procedure being performed Patient consent: the patient's understanding of the procedure matches consent given Test results: test results  available and properly labeled Site marked: the operative site was marked Imaging studies: imaging studies available Patient identity confirmed: arm band and verbally with patient Time out: Immediately prior to procedure a "time out" was called to verify the correct patient, procedure, equipment, support staff and site/side marked as required. Body area: skin General location: lower extremity Location details: right foot Anesthesia: local infiltration  Anesthesia: Local Anesthetic: lidocaine 1% without epinephrine Anesthetic total: 8 mL  Sedation: Patient sedated: no Patient restrained: no Patient cooperative: yes Localization method: ultrasound Removal mechanism: scalpel, forceps and irrigation Dressing: dressing applied 0 objects recovered. Objects recovered: 0 Post-procedure assessment: foreign body not removed Comments: Unable to remove FB at this time.    (including critical care time)  Medications Ordered in ED Medications  Tdap (  BOOSTRIX) injection 0.5 mL (0.5 mLs Intramuscular Given 12/23/16 2105)  lidocaine (XYLOCAINE) 1 % (with pres) injection (10 mLs  Given by Other 12/23/16 2156)     Initial Impression / Assessment and Plan / ED Course  I have reviewed the triage vital signs and the nursing notes.  Pertinent labs & imaging results that were available during my care of the patient were reviewed by me and considered in my medical decision making (see chart for details).     Patient with FB in the right foot. Xrays negative for acute bony abnormalities and no FB appreciated.  Tetanus updated. Bedside with tracking of entry site of FB but no obvious hyperechoic object appreciated. Exploration performed with Dr. Leonette Monarch unsuccessful in removing FB. Wound was irrigated thoroughly and closed with TXA. Bandage applied. Will start the patient on prophylactic abx. Patient has allergy to sulfa and PCN. Will place on Doxy. Advised her that this can cause photosensitivity.  Patient to follow up with Podiatry as soon as possible to have object removed. Specific return precautions discussed. The patient verbalized understanding and agreement with plan. All questions answered. No further questions at this time. The patient appears safe for discharge.   Final Clinical Impressions(s) / ED Diagnoses   Final diagnoses:  Foreign body in right foot, initial encounter    New Prescriptions Discharge Medication List as of 12/24/2016  1:01 AM    START taking these medications   Details  doxycycline (VIBRAMYCIN) 100 MG capsule Take 1 capsule (100 mg total) by mouth 2 (two) times daily., Starting Wed 12/24/2016, Print         Jillyn Ledger, PA-C 12/24/16 1112    Cardama, Grayce Sessions, MD 12/24/16 [phone]

## 2016-12-23 NOTE — ED Triage Notes (Signed)
Pt c/o tooth pick in right foot x 1 hr ago

## 2016-12-24 ENCOUNTER — Ambulatory Visit (INDEPENDENT_AMBULATORY_CARE_PROVIDER_SITE_OTHER): Payer: 59 | Admitting: Podiatry

## 2016-12-24 ENCOUNTER — Encounter: Payer: Self-pay | Admitting: Podiatry

## 2016-12-24 VITALS — BP 130/77 | HR 98 | Resp 16

## 2016-12-24 DIAGNOSIS — S90851A Superficial foreign body, right foot, initial encounter: Secondary | ICD-10-CM

## 2016-12-24 MED ORDER — DOXYCYCLINE HYCLATE 100 MG PO CAPS: 100.0000 mg | ORAL_CAPSULE | Freq: Two times a day (BID) | ORAL | 0 refills | Status: DC

## 2016-12-24 NOTE — ED Notes (Signed)
Pt verbalizes understanding of d/c instructions and denies any further needs at this time. 

## 2016-12-24 NOTE — Discharge Instructions (Signed)
We were unable to remove the foreign body from your food today. Please follow up with podiatry for further care. I have prescribed you a antibiotic to prevent infection. Please walk on ball of your foot to prevent pressure and pain. I have attached wound care instructions. For pain control you may take:  800mg  of ibuprofen (that is usually 4 over the counter pills)  3 times a day (take with food) and acetaminophen 975mg  (this is 3 over the counter pills) four times a day. Do not drink alcohol or combine with other medications that have acetaminophen as an ingredient (Read the labels!). If you develop worsening or new concerning symptoms you can return to the emergency department for re-evaluation.

## 2016-12-24 NOTE — Patient Instructions (Signed)
Pre-Operative Instructions  Congratulations, you have decided to take an important step towards improving your quality of life.  You can be assured that the doctors and staff at [facility] will be with you every step of the way.  Here are some important things you should know:  1. Plan to be at the surgery center/hospital at least 1 (one) hour prior to your scheduled time, unless otherwise directed by the surgical center/hospital staff.  You must have a responsible adult accompany you, remain during the surgery and drive you home.  Make sure you have directions to the surgical center/hospital to ensure you arrive on time. 2. If you are having surgery at [facility] or [facility] hospitals, you will need a copy of your medical history and physical form from your family physician within one month prior to the date of surgery. We will give you a form for your primary physician to complete.  3. We make every effort to accommodate the date you request for surgery.  However, there are times where surgery dates or times have to be moved.  We will contact you as soon as possible if a change in schedule is required.   4. No aspirin/ibuprofen for one week before surgery.  If you are on aspirin, any non-steroidal anti-inflammatory medications (Mobic, Aleve, Ibuprofen) should not be taken seven (7) days prior to your surgery.  You make take Tylenol for pain prior to surgery.  5. Medications - If you are taking daily heart and blood pressure medications, seizure, reflux, allergy, asthma, anxiety, pain or diabetes medications, make sure you notify the surgery center/hospital before the day of surgery so they can tell you which medications you should take or avoid the day of surgery. 6. No food or drink after midnight the night before surgery unless directed otherwise by surgical center/hospital staff. 7. No alcoholic beverages 24-hours prior to surgery.  No smoking 24-hours prior or 24-hours after  surgery. 8. Wear loose pants or shorts. They should be loose enough to fit over bandages, boots, and casts. 9. Don't wear slip-on shoes. Sneakers are preferred. 10. Bring your boot with you to the surgery center/hospital.  Also bring crutches or a walker if your physician has prescribed it for you.  If you do not have this equipment, it will be provided for you after surgery. 11. If you have not been contacted by the surgery center/hospital by the day before your surgery, call to confirm the date and time of your surgery. 12. Leave-time from work may vary depending on the type of surgery you have.  Appropriate arrangements should be made prior to surgery with your employer. 13. Prescriptions will be provided immediately following surgery by your doctor.  Fill these as soon as possible after surgery and take the medication as directed. Pain medications will not be refilled on weekends and must be approved by the doctor. 14. Remove nail polish on the operative foot and avoid getting pedicures prior to surgery. 15. Wash the night before surgery.  The night before surgery wash the foot and leg well with water and the antibacterial soap provided. Be sure to pay special attention to beneath the toenails and in between the toes.  Wash for at least three (3) minutes. Rinse thoroughly with water and dry well with a towel.  Perform this wash unless told not to do so by your physician.  Enclosed: 1 Ice pack (please put in freezer the night before surgery)   1 Hibiclens skin cleaner     Pre-op instructions  If you have any questions regarding the instructions, please do not hesitate to call our office.  [place]: [address], [place], [place] [address] -- [phone]  [place]: [address] [place], [place] [address] -- [phone]  [place]: [address]  [place], [place] [address] -- [phone]  [place]: [address], [address], [place], [place] [address] -- [phone]  Website:  [email or web address] 

## 2016-12-24 NOTE — Progress Notes (Signed)
   Subjective:    Patient ID: Amy Mccall, female    DOB: 06-27-1975, 41 y.o.   MRN: [id number]  HPI Chief Complaint  Patient presents with  . Foot Injury    Right foot; bottom of heel; pt stated, "Stepped on a toothpick last night (12/23/16); toothpick got stuck in bottom of heel; there's a broken piece still in bottom of foot; went to [facility] ER last night, they tried to dig it out, but could not find anything"      Review of Systems  Musculoskeletal: Positive for gait problem.  All other systems reviewed and are negative.      Objective:   Physical Exam        Assessment & Plan:

## 2016-12-25 ENCOUNTER — Encounter: Payer: Self-pay | Admitting: Podiatry

## 2016-12-25 ENCOUNTER — Telehealth: Payer: Self-pay | Admitting: *Deleted

## 2016-12-25 DIAGNOSIS — K219 Gastro-esophageal reflux disease without esophagitis: Secondary | ICD-10-CM | POA: Diagnosis not present

## 2016-12-25 DIAGNOSIS — S90851A Superficial foreign body, right foot, initial encounter: Secondary | ICD-10-CM | POA: Diagnosis not present

## 2016-12-25 DIAGNOSIS — L923 Foreign body granuloma of the skin and subcutaneous tissue: Secondary | ICD-10-CM | POA: Diagnosis not present

## 2016-12-25 DIAGNOSIS — S91321D Laceration with foreign body, right foot, subsequent encounter: Secondary | ICD-10-CM | POA: Diagnosis not present

## 2016-12-25 DIAGNOSIS — S91321A Laceration with foreign body, right foot, initial encounter: Secondary | ICD-10-CM | POA: Diagnosis not present

## 2016-12-25 NOTE — Telephone Encounter (Addendum)
Pt's mtr, Amy Mccall states pt had surgery with Dr. Amalia Hailey to remove a toothpick from her foot, and pt would like to know if she can take the boot off to ice. I told Amy Mccall pt could remove the boot to rest, and ice, but must be in the boot to sleep or weight bear, due to the boot holds dressing and suture line support. I told Amy Mccall pt should not weight bear, dangle or have foot below the heart more than 15 minutes/hour. Amy Mccall states understanding. Pt called states she thinks the bandage is too tight. I told pt to remove the surgery boot, open-ended sock, and ace wrap, then elevate the foot for 15 minutes, but if pain worsens dangle for 15 minutes, after 15 minutes then place foot level with hip and rewrap the ace looserstarting a the toes and reapply the open-ended sock, and the boot. Pt states she is in the recliner with foot also on 2 pills. I told pt that the elevation of the recliner was fine, could use a pillow for elevation on the sofa or bed. Pt states understanding.

## 2016-12-30 ENCOUNTER — Encounter: Payer: Self-pay | Admitting: Podiatry

## 2016-12-31 ENCOUNTER — Ambulatory Visit (INDEPENDENT_AMBULATORY_CARE_PROVIDER_SITE_OTHER): Payer: Self-pay | Admitting: Podiatry

## 2016-12-31 ENCOUNTER — Ambulatory Visit (INDEPENDENT_AMBULATORY_CARE_PROVIDER_SITE_OTHER): Payer: 59 | Admitting: Family Medicine

## 2016-12-31 ENCOUNTER — Encounter: Payer: Self-pay | Admitting: Podiatry

## 2016-12-31 ENCOUNTER — Telehealth: Payer: Self-pay | Admitting: Sports Medicine

## 2016-12-31 ENCOUNTER — Encounter: Payer: Self-pay | Admitting: Family Medicine

## 2016-12-31 VITALS — BP 149/97 | HR 116 | Temp 99.7°F

## 2016-12-31 VITALS — BP 138/92 | HR 126 | Temp 99.3°F | Ht 67.0 in | Wt 227.0 lb

## 2016-12-31 DIAGNOSIS — N3000 Acute cystitis without hematuria: Secondary | ICD-10-CM | POA: Diagnosis not present

## 2016-12-31 DIAGNOSIS — Z9889 Other specified postprocedural states: Secondary | ICD-10-CM

## 2016-12-31 DIAGNOSIS — R509 Fever, unspecified: Secondary | ICD-10-CM

## 2016-12-31 LAB — POC URINALSYSI DIPSTICK (AUTOMATED)
Bilirubin, UA: NEGATIVE
Glucose, UA: NEGATIVE
Ketones, UA: NEGATIVE
Nitrite, UA: NEGATIVE
RBC UA: NEGATIVE
SPEC GRAV UA: 1.025 (ref 1.010–1.025)
UROBILINOGEN UA: 0.2 U/dL
pH, UA: 6 (ref 5.0–8.0)

## 2016-12-31 MED ORDER — NITROFURANTOIN MACROCRYSTAL 100 MG PO CAPS: 100.0000 mg | ORAL_CAPSULE | Freq: Two times a day (BID) | ORAL | 0 refills | Status: AC

## 2016-12-31 NOTE — Progress Notes (Signed)
   Subjective:  Patient presents today status post removal foreign body right plantar heel. Patient states that she has had a fever last night and took Tylenol this morning. Today her temperature is 99.38F. Blood pressure 149/97. Patient states that she does not feel very well this morning she does have a history of DVT and pulmonary embolism.    Objective/Physical Exam Skin incisions appear to be well coapted with sutures and staples intact. No sign of infectious process noted. No dehiscence. No active bleeding noted. Negative for any significant edema noted to the surgical extremity. Negative for any significant calf pain. Negative for shortness of breath.   Assessment: 1. s/p removal foreign body right plantar heel. DOS: 12/25/2016   Plan of Care:  1. Patient was evaluated.  2. Today dressings were changed. Patient can begin showering. Recommend daily antibiotic ointment and a large Band-Aid to the incision site. 3. Recommend going to emergency department immediately if she did experience shortness of breath or continued fever or Pain. Continue aspirin daily 4. Return to clinic in 2 weeks for suture removal   Edrick Kins, DPM [facility]  Dr. Edrick Kins, [place]                                        [place], [place] [address]                Office [phone]  Fax [phone]

## 2016-12-31 NOTE — Addendum Note (Signed)
Addended by: Sharon Seller B on: 12/31/2016 12:08 PM   Modules accepted: Orders

## 2016-12-31 NOTE — Progress Notes (Signed)
Chief Complaint  Patient presents with  . Fever  . Hypertension    Subjective: Patient is a 41 y.o. female here for fever.  The patient stepped on a toothpick around 1 week ago and had surgery on 8/23 to have it removed. She saw her surgeon this morning who did not think her foot was infected. He recommended she follow-up with her PCP for further evaluation. The fever started yesterday. It got up to 102 F. other than malaise, the patient has no complaints. She is not having any ear pain, upper respiratory symptoms, shortness of breath, cough, sore throat, abdominal pain, nausea, vomiting, diarrhea, urinary complaint, or rashes. She took Mobic this morning and Tylenol around 2 hours ago.   ROS: Const: +fever Lungs: Denies SOB   Family History  Problem Relation Age of Onset  . Osteoporosis Mother   . COPD Father   . Diabetes Father   . Heart disease Father   . Colon cancer Neg Hx    Past Medical History:  Diagnosis Date  . DVT (deep venous thrombosis) ([place]) 2014   turned into PE  . Generalized anxiety disorder    Follows with a Social worker  . GERD (gastroesophageal reflux disease)   . PE (pulmonary thromboembolism) ([place]) 2014  . Seasonal allergies   . Thyroid cancer ([place])    Removed thyroid in Aug, 2017, radiation in October   Allergies  Allergen Reactions  . 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 Penicillins Rash    Childhood allergy Childhood allergy Childhood allergy    Current Outpatient Prescriptions:  .  aspirin EC 81 MG tablet, Take 81 mg by mouth daily., Disp: , Rfl:  .  cetirizine (ZYRTEC) 10 MG tablet, Take 10 mg by mouth daily as needed for allergies., Disp: , Rfl:  .  cholecalciferol (VITAMIN D) 1000 units tablet, Take 1,000 Units by mouth daily., Disp: , Rfl:  .  esomeprazole (NEXIUM) 20 MG capsule, Take 20 mg by mouth daily at 12  noon., Disp: , Rfl:  .  levothyroxine (SYNTHROID, LEVOTHROID) 150 MCG tablet, Take 150 mcg by mouth daily before breakfast. , Disp: , Rfl:  .  methocarbamol (ROBAXIN) 500 MG tablet, Take 1 tablet (500 mg total) by mouth 2 (two) times daily., Disp: 20 tablet, Rfl: 0 .  norethindrone (MICRONOR,CAMILA,ERRIN) 0.35 MG tablet, Take 1 tablet by mouth daily., Disp: , Rfl:  .  nitrofurantoin (MACRODANTIN) 100 MG capsule, Take 1 capsule (100 mg total) by mouth 2 (two) times daily., Disp: 10 capsule, Rfl: 0  Objective: BP (!) 138/92 (BP Location: Left Arm, Patient Position: Sitting, Cuff Size: Large)   Pulse (!) 126   Temp 99.3 F (37.4 C) (Oral)   Ht 5\' 7"  (1.702 m)   Wt 227 lb (103 kg)   SpO2 97%   BMI 35.55 kg/m  General: Awake, appears stated age HEENT: MMM, EOMi, ears neg b/l, pharynx neg Heart: RRR, 2/6 SEM heard loudest at tricuspid listening post Lungs: CTAB, no rales, wheezes or rhonchi. No accessory muscle use Abd: BS+, soft, NT, ND, no masses or organomegaly Skin: No rashes on exposed surfaces; I looked at the picture of the foot and I agree with her podiatrist. Psych: Age appropriate judgment and insight, normal affect and mood  Assessment and Plan: Fever, unspecified fever cause  Acute cystitis without hematuria - Plan: nitrofurantoin (MACRODANTIN) 100 MG capsule  Orders as above. UA  does show evidence of infection, will tx given fever and culture.  F/u prn. The patient voiced understanding and agreement to the plan.  [place], DO 12/31/16  11:59 AM

## 2016-12-31 NOTE — Progress Notes (Signed)
Pre visit review using our clinic review tool, if applicable. No additional management support is needed unless otherwise documented below in the visit note. 

## 2016-12-31 NOTE — Patient Instructions (Addendum)
If you start having some sort of symptoms to accompany your fever, let us know.  Take frequent and deep breaths while awake.  I would like you to follow up Monday if you are not doing better, sooner if you worsen. ER if you cannot wait.  Only treat fever if you are feeling miserable.  I would like to revisit blood pressure if it is still elevated after the fever resolves.   Let us know if you need anything.

## 2016-12-31 NOTE — Telephone Encounter (Signed)
Patient called stating that she finished antibiotics on yesterday and since has had a fever of 102. Took Tylenol and got fever down to 101. She denies any other symptoms. I advised patient to go to ER or Urgent care if fever persists. Patient expressed understanding and states that she will monitor and if not better before her office appointment today will go to ER. -Dr. Cannon Kettle

## 2017-01-01 LAB — URINE CULTURE

## 2017-01-03 NOTE — Progress Notes (Signed)
Patient ID: Amy Mccall, female   DOB: Oct 15, 1975, 41 y.o.   MRN: [id number]   HPI: 41 year old female presents to office today for evaluation of right heel pain. Patient states that the night prior to presentation she stepped on a toothpick on her right heel. She went to the emergency department where x-rays were taken and some of the toothpick fragment was removed. She presents today for follow-up treatment and evaluation. She believes that there is still some wooden toothpick remaining inside of her heel.   Physical Exam: General: The patient is alert and oriented x3 in no acute distress.  Dermatology: Skin is warm, dry and supple bilateral lower extremities. Small incisional laceration noted to the plantar aspect of the heel from the emergency department. Incision measures approximately 0.5 cm. There is some localized erythema just surrounding the incisional site likely reactive and not infectious.  Vascular: Palpable pedal pulses bilaterally. No edema or erythema noted. Capillary refill within normal limits.  Neurological: Epicritic and protective threshold grossly intact bilaterally.   Musculoskeletal Exam: Pain on palpation noted surrounding the right plantar heel. Range of motion within normal limits to all pedal and ankle joints bilateral. Muscle strength 5/5 in all groups bilateral.    Assessment: 1. Foreign body/tooth pick right plantar heel   Plan of Care:  1. Patient was evaluated. 2. Today we discussed surgical versus conservative management. The patient likely needs surgical exploration and removal of any remaining foreign body fragments. Authorization for surgery was initiated today. Surgery will consist of incision and drainage with removal of foreign body right plantar heel. All possible occasions and details the procedure were explained. No guarantees were expressed or implied. All patient questions answered. 3. We will try and undergo surgical intervention tomorrow a.m. at the  [facility] specialty surgery center. 4. Return to clinic in 1 week postop 5. Continue oral antibiotics as prescribed at the emergency department. On doxycycline 100 mg.   Edrick Kins, DPM [facility]  Dr. Edrick Kins, DPM    [address], [place] [address]                Office [phone]  Fax [phone]

## 2017-01-06 ENCOUNTER — Other Ambulatory Visit: Payer: Self-pay

## 2017-01-06 DIAGNOSIS — C73 Malignant neoplasm of thyroid gland: Secondary | ICD-10-CM | POA: Diagnosis not present

## 2017-01-06 NOTE — Progress Notes (Signed)
DOS 12/25/16 Excision/removal of foreign body Rt plantar heel

## 2017-01-07 ENCOUNTER — Encounter: Payer: 59 | Admitting: Podiatry

## 2017-01-14 ENCOUNTER — Ambulatory Visit (INDEPENDENT_AMBULATORY_CARE_PROVIDER_SITE_OTHER): Payer: Self-pay | Admitting: Podiatry

## 2017-01-14 ENCOUNTER — Encounter: Payer: Self-pay | Admitting: Podiatry

## 2017-01-14 VITALS — BP 136/89 | HR 78 | Temp 98.6°F

## 2017-01-14 DIAGNOSIS — Z9889 Other specified postprocedural states: Secondary | ICD-10-CM

## 2017-01-14 NOTE — Progress Notes (Signed)
   Subjective:  Patient presents today status post removal foreign body right plantar heel. Patient doing much better and denies pain.    Objective/Physical Exam Skin incisions appear to be well coapted with sutures and staples intact. No sign of infectious process noted. No dehiscence. No active bleeding noted. Negative for any significant edema noted to the surgical extremity. Negative for any significant calf pain. Negative for shortness of breath.   Assessment: 1. s/p removal foreign body right plantar heel. DOS: 12/25/2016   Plan of Care:  1. Patient was evaluated.  2.  Today sutures were removed. Sin incision is well coapted and healed. Resume normal activity. Recommend antibiotic ointment and a BandAid daily 1 week. 3. Full activity no restrictions. RTC prn.    Edrick Kins, DPM [facility]  Dr. Edrick Kins, [address]                                        [place], [place] [address]                Office [phone]  Fax [phone]

## 2017-03-08 ENCOUNTER — Encounter: Payer: Self-pay | Admitting: Family Medicine

## 2017-03-09 MED ORDER — SERTRALINE HCL 50 MG PO TABS: 50.0000 mg | ORAL_TABLET | Freq: Every day | ORAL | 3 refills | Status: DC

## 2017-03-09 NOTE — Telephone Encounter (Signed)
Zoloft restarted. Please contact pt and schedule in around 6 weeks to check on anxiety med. TY.

## 2017-04-08 DIAGNOSIS — E890 Postprocedural hypothyroidism: Secondary | ICD-10-CM | POA: Diagnosis not present

## 2017-04-08 DIAGNOSIS — C73 Malignant neoplasm of thyroid gland: Secondary | ICD-10-CM | POA: Diagnosis not present

## 2017-04-08 DIAGNOSIS — E063 Autoimmune thyroiditis: Secondary | ICD-10-CM | POA: Diagnosis not present

## 2017-04-08 DIAGNOSIS — E038 Other specified hypothyroidism: Secondary | ICD-10-CM | POA: Diagnosis not present

## 2017-04-08 DIAGNOSIS — R002 Palpitations: Secondary | ICD-10-CM | POA: Diagnosis not present

## 2017-04-20 ENCOUNTER — Ambulatory Visit: Payer: 59 | Admitting: Family Medicine

## 2017-04-20 ENCOUNTER — Encounter: Payer: Self-pay | Admitting: Family Medicine

## 2017-04-20 VITALS — BP 110/78 | HR 75 | Temp 98.5°F | Ht 67.0 in | Wt 217.4 lb

## 2017-04-20 DIAGNOSIS — F411 Generalized anxiety disorder: Secondary | ICD-10-CM

## 2017-04-20 MED ORDER — SERTRALINE HCL 50 MG PO TABS: 50.0000 mg | ORAL_TABLET | Freq: Every day | ORAL | 3 refills | Status: DC

## 2017-04-20 NOTE — Progress Notes (Signed)
Pre visit review using our clinic review tool, if applicable. No additional management support is needed unless otherwise documented below in the visit note. 

## 2017-04-20 NOTE — Patient Instructions (Signed)
Aim to do some physical exertion for 150 minutes per week. This is typically divided into 5 days per week, 30 minutes per day. The activity should be enough to get your heart rate up. Anything is better than nothing if you have time constraints.  Let us know if you need anything.  

## 2017-04-20 NOTE — Progress Notes (Signed)
Chief Complaint  Patient presents with  . Anxiety    Subjective Amy Mccall presents for f/u anxiety/depression.  She is currently being treated with Zoloft 50 mg/d. Dx'd with OCD by PsyD? Reports improvement since treatment. No routine exercise.  No thoughts of harming self or others. No self-medication with alcohol, prescription drugs or illicit drugs. Pt is following with a counselor/psychologist.  ROS Psych: No homicidal or suicidal thoughts  Past Medical History:  Diagnosis Date  . DVT (deep venous thrombosis) ([place]) 2014   turned into PE  . Generalized anxiety disorder    Follows with a Social worker  . GERD (gastroesophageal reflux disease)   . PE (pulmonary thromboembolism) ([place]) 2014  . Seasonal allergies   . Thyroid cancer ([place])    Removed thyroid in Aug, 2017, radiation in October     Exam BP 110/78 (BP Location: Left Arm, Patient Position: Sitting, Cuff Size: Large)   Pulse 75   Temp 98.5 F (36.9 C) (Oral)   Ht 5\' 7"  (1.702 m)   Wt 217 lb 6 oz (98.6 kg)   SpO2 97%   BMI 34.05 kg/m  General:  well developed, well nourished, in no apparent distress Neck: neck supple without adenopathy, thyromegaly, or masses Lungs:  clear to auscultation, breath sounds equal bilaterally, no respiratory distress Heart: RRR Neuro: DTR's equal and symmetric Psych: well oriented with normal range of affect and age-appropriate judgement/insight, alert and oriented x4.  Assessment and Plan  GAD (generalized anxiety disorder) - Plan: sertraline (ZOLOFT) 50 MG tablet  Cont above, pt content w dose. Counseled on exercise. F/u in 6 mo or prn. The patient voiced understanding and agreement to the plan.  [place], DO 04/20/17 11:22 AM

## 2017-06-08 ENCOUNTER — Other Ambulatory Visit: Payer: Self-pay | Admitting: Family Medicine

## 2017-06-08 ENCOUNTER — Encounter: Payer: Self-pay | Admitting: Family Medicine

## 2017-06-08 MED ORDER — SERTRALINE HCL 100 MG PO TABS: 100.0000 mg | ORAL_TABLET | Freq: Every day | ORAL | 3 refills | Status: DC

## 2017-06-08 NOTE — Progress Notes (Signed)
100 mg dose of Zoloft called in. Pt instructed to sched f/u in 6 weeks.

## 2017-07-20 ENCOUNTER — Ambulatory Visit: Payer: 59 | Admitting: Family Medicine

## 2017-07-20 ENCOUNTER — Encounter: Payer: Self-pay | Admitting: Family Medicine

## 2017-07-20 VITALS — BP 120/82 | HR 86 | Temp 99.0°F | Ht 67.0 in | Wt 218.4 lb

## 2017-07-20 DIAGNOSIS — F411 Generalized anxiety disorder: Secondary | ICD-10-CM

## 2017-07-20 MED ORDER — VENLAFAXINE HCL ER 75 MG PO CP24
75.0000 mg | ORAL_CAPSULE | Freq: Every day | ORAL | 2 refills | Status: DC
Start: 2017-07-20 — End: 2017-09-07

## 2017-07-20 NOTE — Progress Notes (Signed)
Chief Complaint  Patient presents with  . Medication Problem    no change in dose change    Subjective Amy Mccall presents for f/u GAD.  She is currently being treated with Zoloft 100 mg/d.  Reports regression since treatment.  Having high levels of anxiety, +stressors with son getting in trouble, husband has not been around a lot, work has been busy with people out sick. She is not routinely exercising. No thoughts of harming self or others. No self-medication with alcohol, prescription drugs or illicit drugs. Pt is following with a counselor/psychologist. She has failed Lexapro in past.   ROS Psych: No homicidal or suicidal thoughts  Past Medical History:  Diagnosis Date  . DVT (deep venous thrombosis) ([place]) 2014   turned into PE  . Generalized anxiety disorder    Follows with a Social worker  . GERD (gastroesophageal reflux disease)   . PE (pulmonary thromboembolism) ([place]) 2014  . Seasonal allergies   . Thyroid cancer ([place])    Removed thyroid in Aug, 2017, radiation in October     Exam BP 120/82 (BP Location: Left Arm, Patient Position: Sitting, Cuff Size: Large)   Pulse 86   Temp 99 F (37.2 C) (Oral)   Ht 5\' 7"  (1.702 m)   Wt 218 lb 6 oz (99.1 kg)   SpO2 97%   BMI 34.20 kg/m  General:  well developed, well nourished, in no apparent distress Lungs:  no respiratory distress Psych: well oriented with normal range of affect and age-appropriate judgement/insight, alert and oriented x4.  Assessment and Plan  GAD (generalized anxiety disorder) - Plan: venlafaxine XR (EFFEXOR-XR) 75 MG 24 hr capsule  Orders as above. Change Zoloft to Effexor. Counseled on exercise.  Cont w counseling.  F/u in 6 weeks. The patient voiced understanding and agreement to the plan.  [place], DO 07/20/17 9:34 AM

## 2017-07-20 NOTE — Patient Instructions (Addendum)
Aim to do some physical exertion for 150 minutes per week. This is typically divided into 5 days per week, 30 minutes per day. The activity should be enough to get your heart rate up. Anything is better than nothing if you have time constraints.  Stop Zoloft.  Let us know if you need anything.

## 2017-07-20 NOTE — Progress Notes (Signed)
Pre visit review using our clinic review tool, if applicable. No additional management support is needed unless otherwise documented below in the visit note. 

## 2017-07-24 DIAGNOSIS — Z1231 Encounter for screening mammogram for malignant neoplasm of breast: Secondary | ICD-10-CM | POA: Diagnosis not present

## 2017-07-24 DIAGNOSIS — Z86711 Personal history of pulmonary embolism: Secondary | ICD-10-CM | POA: Diagnosis not present

## 2017-07-24 DIAGNOSIS — Z01419 Encounter for gynecological examination (general) (routine) without abnormal findings: Secondary | ICD-10-CM | POA: Diagnosis not present

## 2017-08-10 DIAGNOSIS — Z1231 Encounter for screening mammogram for malignant neoplasm of breast: Secondary | ICD-10-CM | POA: Diagnosis not present

## 2017-09-02 ENCOUNTER — Ambulatory Visit: Payer: 59 | Admitting: Family Medicine

## 2017-09-07 ENCOUNTER — Ambulatory Visit: Payer: 59 | Admitting: Family Medicine

## 2017-09-07 ENCOUNTER — Encounter: Payer: Self-pay | Admitting: Family Medicine

## 2017-09-07 VITALS — BP 112/72 | HR 103 | Temp 98.6°F | Ht 67.0 in | Wt 225.2 lb

## 2017-09-07 DIAGNOSIS — F411 Generalized anxiety disorder: Secondary | ICD-10-CM | POA: Insufficient documentation

## 2017-09-07 MED ORDER — VENLAFAXINE HCL ER 75 MG PO CP24: 75.0000 mg | ORAL_CAPSULE | Freq: Every day | ORAL | 1 refills | Status: DC

## 2017-09-07 NOTE — Patient Instructions (Signed)
Keep up the great work.  Consider increasing the fiber in your diet. Stay well hydrated. Consider Metamucil or fiber supplements.  Sleep Hygiene Tips:  Do not watch TV or look at screens within 1 hour of going to bed. If you do, make sure there is a blue light filter (nighttime mode) involved.  Try to go to bed around the same time every night. Wake up at the same time within 1 hour of regular time. Ex: If you wake up at 7 AM for work, do not sleep past 8 AM on days that you don't work.  Do not drink alcohol before bedtime.  Do not consume caffeine-containing beverages after noon or within 9 hours of intended bedtime.  Get regular exercise/physical activity in your life, but not within 2 hours of planned bedtime.  Do not take naps.   Do not eat within 2 hours of planned bedtime.  Melatonin, 3-5 mg 30-60 minutes before planned bedtime may be helpful.   The bed should be for sleep or sex only. If after 20-30 minutes you are unable to fall asleep, get up and do something relaxing. Do this until you feel ready to go to sleep again.

## 2017-09-07 NOTE — Progress Notes (Signed)
Pre visit review using our clinic review tool, if applicable. No additional management support is needed unless otherwise documented below in the visit note. 

## 2017-09-07 NOTE — Progress Notes (Signed)
Chief Complaint  Patient presents with  . Anxiety    Subjective Amy Mccall presents for f/u anxiety/depression.  She is currently being treated with Effexor 75 mg XR/d.  Reports significant improvement since treatment. Some side effects of constipation and freq nighttime awakenings.  Pt is following with a counselor/psychologist.  ROS Psych: No homicidal or suicidal thoughts  Past Medical History:  Diagnosis Date  . DVT (deep venous thrombosis) ([place]) 2014   turned into PE  . Generalized anxiety disorder    Follows with a Social worker  . GERD (gastroesophageal reflux disease)   . PE (pulmonary thromboembolism) ([place]) 2014  . Seasonal allergies   . Thyroid cancer ([place])    Removed thyroid in Aug, 2017, radiation in October     Exam BP 112/72 (BP Location: Left Arm, Patient Position: Sitting, Cuff Size: Large)   Pulse (!) 103   Temp 98.6 F (37 C) (Oral)   Ht 5\' 7"  (1.702 m)   Wt 225 lb 4 oz (102.2 kg)   SpO2 96%   BMI 35.28 kg/m  General:  well developed, well nourished, in no apparent distress Lungs:  no respiratory distress Psych: well oriented with normal range of affect and age-appropriate judgement/insight, alert and oriented x4.  Assessment and Plan  GAD (generalized anxiety disorder) - Plan: venlafaxine XR (EFFEXOR-XR) 75 MG 24 hr capsule  She looks really good today. Cont Effexor.  Sleep hygiene discussed for her side effect of freq awakenings.  Discussed changing diet and fiber supplements.  F/u in 3 mo. Will discuss weaning off or keeping things the same.  The patient voiced understanding and agreement to the plan.  [place], DO 09/07/17 10:42 AM

## 2017-10-19 ENCOUNTER — Ambulatory Visit: Payer: 59 | Admitting: Family Medicine

## 2017-10-22 DIAGNOSIS — E079 Disorder of thyroid, unspecified: Secondary | ICD-10-CM | POA: Diagnosis not present

## 2017-10-22 DIAGNOSIS — E063 Autoimmune thyroiditis: Secondary | ICD-10-CM | POA: Diagnosis not present

## 2017-10-22 DIAGNOSIS — E041 Nontoxic single thyroid nodule: Secondary | ICD-10-CM | POA: Diagnosis not present

## 2017-10-22 DIAGNOSIS — C73 Malignant neoplasm of thyroid gland: Secondary | ICD-10-CM | POA: Diagnosis not present

## 2017-12-14 ENCOUNTER — Ambulatory Visit: Payer: 59 | Admitting: Family Medicine

## 2017-12-28 ENCOUNTER — Ambulatory Visit: Payer: 59 | Admitting: Family Medicine

## 2017-12-29 DIAGNOSIS — K219 Gastro-esophageal reflux disease without esophagitis: Secondary | ICD-10-CM | POA: Insufficient documentation

## 2017-12-29 DIAGNOSIS — J069 Acute upper respiratory infection, unspecified: Secondary | ICD-10-CM | POA: Diagnosis not present

## 2017-12-29 DIAGNOSIS — J060 Acute laryngopharyngitis: Secondary | ICD-10-CM | POA: Diagnosis not present

## 2017-12-29 DIAGNOSIS — J0141 Acute recurrent pansinusitis: Secondary | ICD-10-CM | POA: Diagnosis not present

## 2018-01-21 DIAGNOSIS — E890 Postprocedural hypothyroidism: Secondary | ICD-10-CM | POA: Diagnosis not present

## 2018-02-01 IMAGING — DX DG CHEST 2V
2 series · 2 of 2 positions shown · non-contrast
Comparison: None.

CLINICAL DATA: Chest pain, onset today.  Shortness of breath.

EXAM:
CHEST  2 VIEW

[chest pa]
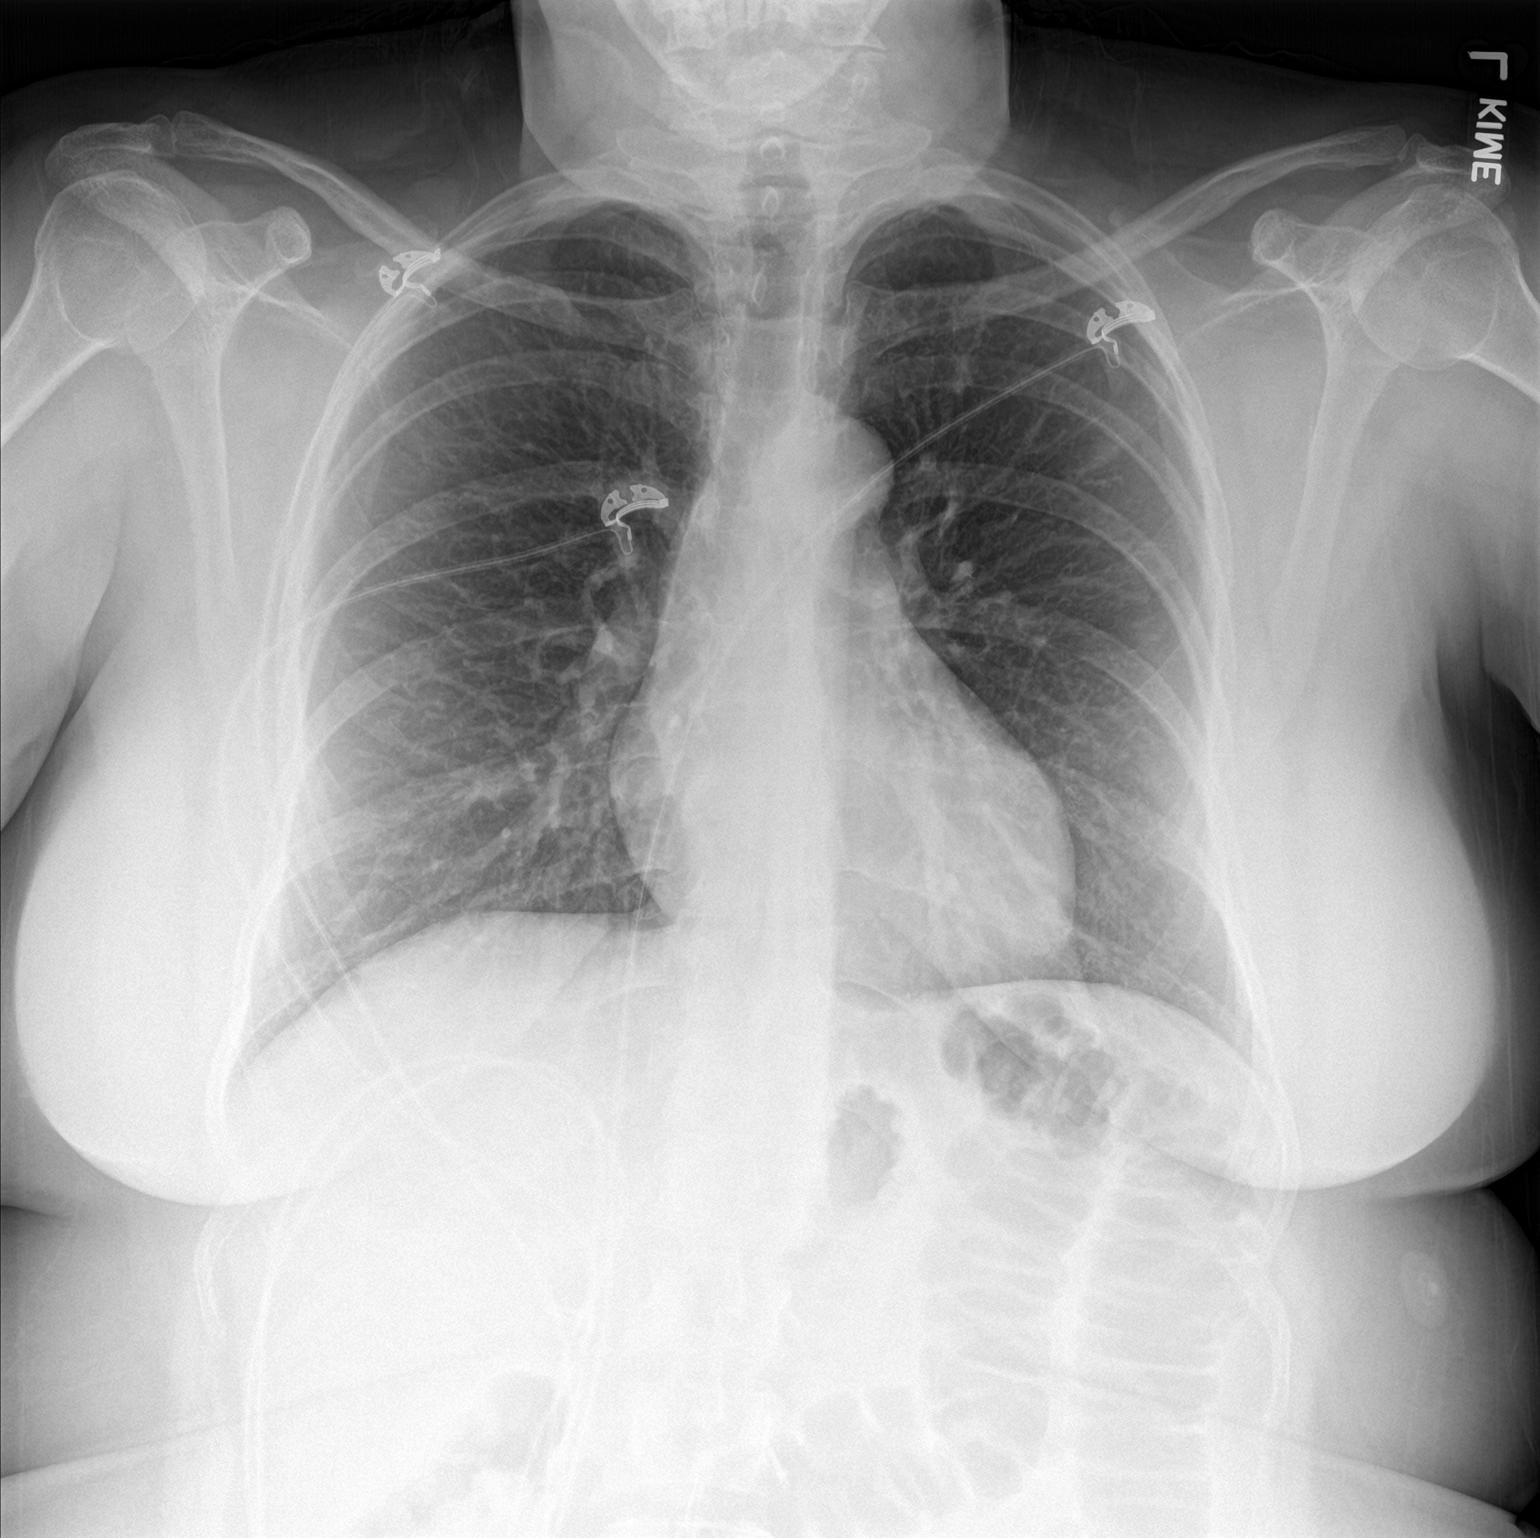

[chest lat]
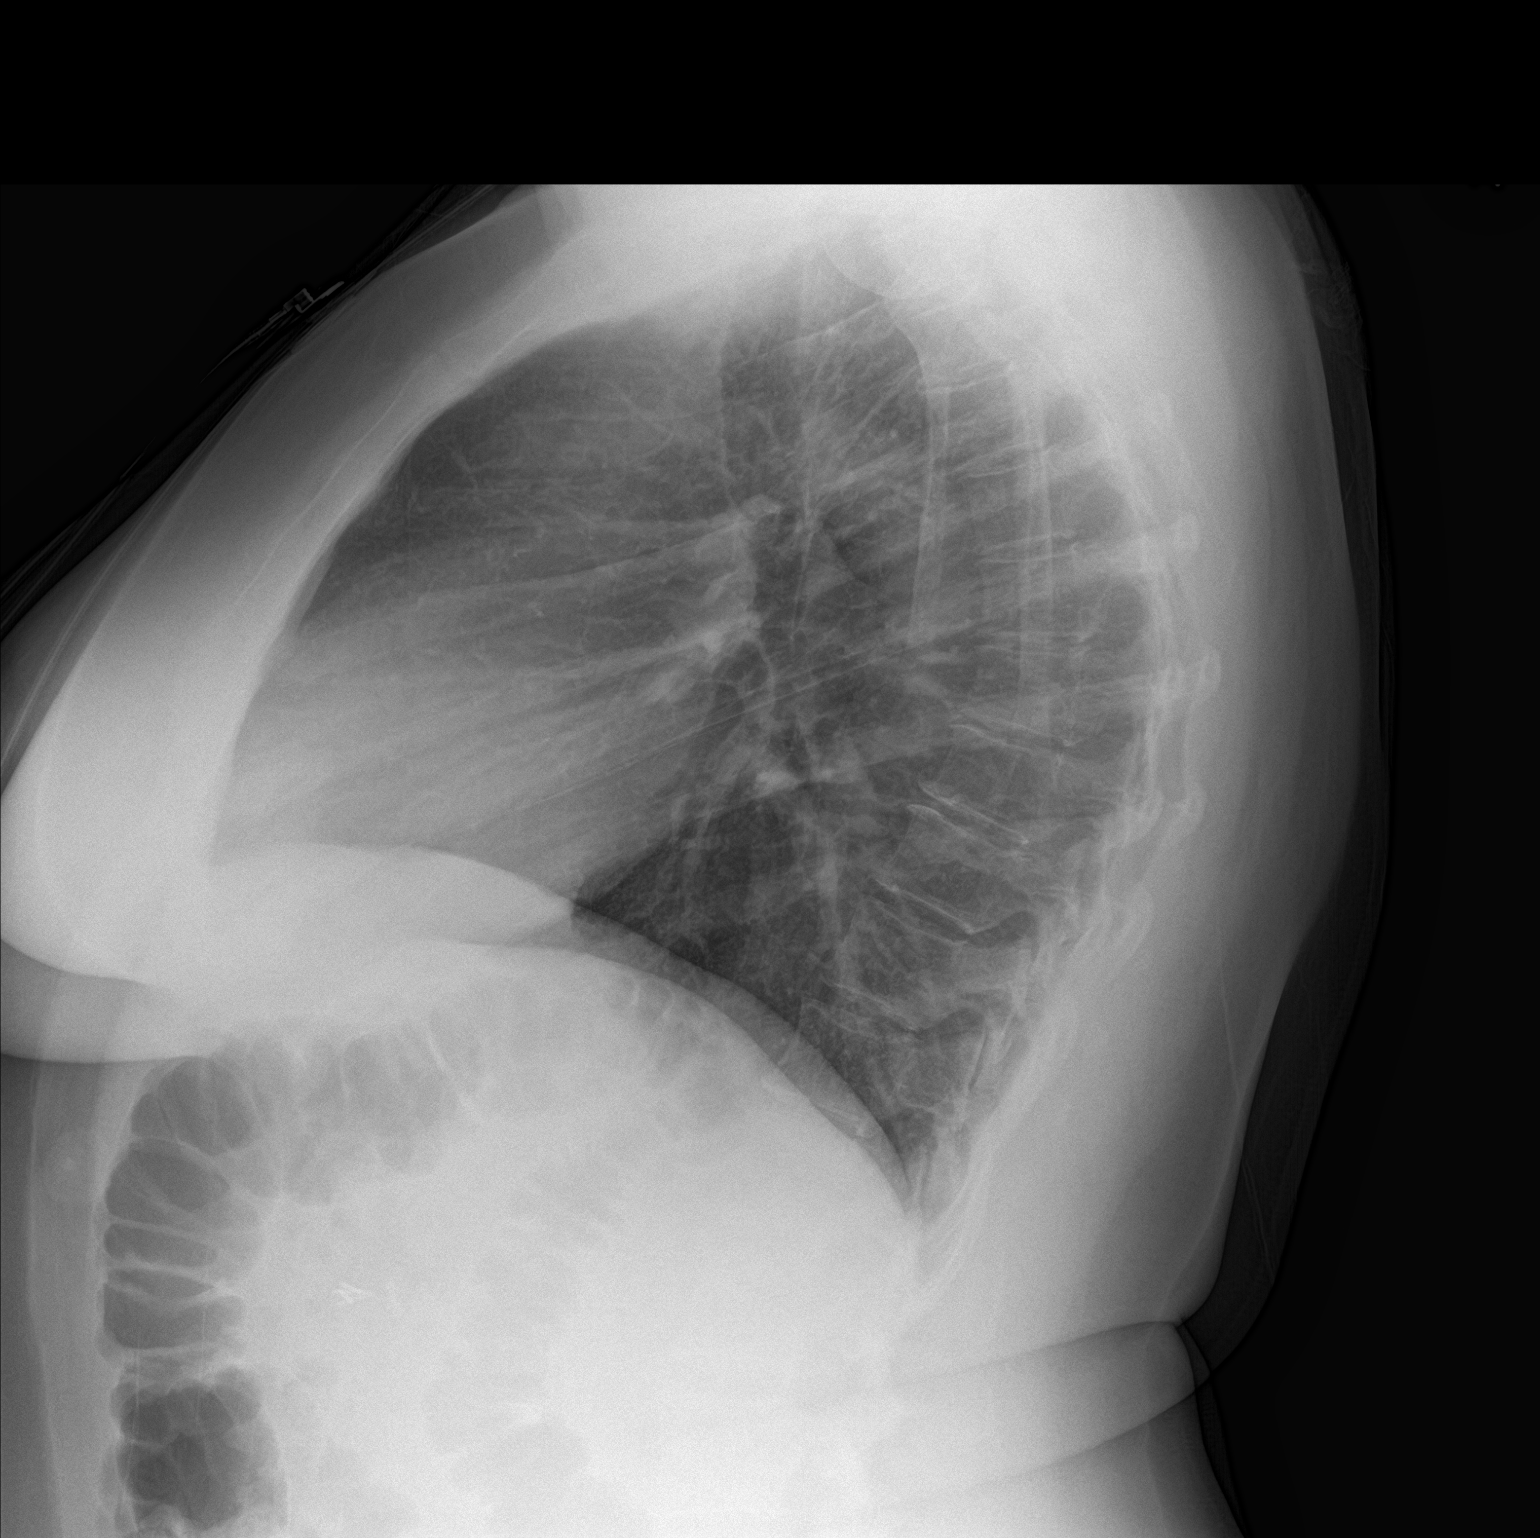

[2 of 2 positions shown; findings below may reference images not displayed]

FINDINGS: The cardiomediastinal contours are normal. The lungs are clear.
Pulmonary vasculature is normal. No consolidation, pleural effusion,
or pneumothorax. No acute osseous abnormalities are seen.
IMPRESSION: No acute pulmonary process.

## 2018-02-04 ENCOUNTER — Encounter: Payer: Self-pay | Admitting: Family Medicine

## 2018-02-04 ENCOUNTER — Ambulatory Visit: Payer: 59 | Admitting: Family Medicine

## 2018-02-04 VITALS — BP 120/86 | HR 84 | Temp 99.2°F | Ht 67.0 in | Wt 243.0 lb

## 2018-02-04 DIAGNOSIS — Z23 Encounter for immunization: Secondary | ICD-10-CM | POA: Diagnosis not present

## 2018-02-04 DIAGNOSIS — E669 Obesity, unspecified: Secondary | ICD-10-CM | POA: Insufficient documentation

## 2018-02-04 DIAGNOSIS — F411 Generalized anxiety disorder: Secondary | ICD-10-CM

## 2018-02-04 NOTE — Patient Instructions (Addendum)
Let us know if you need refills.  Aim to do some physical exertion for 150 minutes per week. This is typically divided into 5 days per week, 30 minutes per day. The activity should be enough to get your heart rate up. Anything is better than nothing if you have time constraints.  Keep the diet clean.  Goal weight: 65 lbs long term; 25 lb weight loss is your goal for the next 6 mo. 1 lb per week on average.  Let us know if you need anything.  Healthy Eating Plan Many factors influence your heart health, including eating and exercise habits. Heart (coronary) risk increases with abnormal blood fat (lipid) levels. Heart-healthy meal planning includes limiting unhealthy fats, increasing healthy fats, and making other small dietary changes. This includes maintaining a healthy body weight to help keep lipid levels within a normal range.  WHAT IS MY PLAN?  Your health care provider recommends that you:  Drink a glass of water before meals to help with satiety.  Eat slowly.  An alternative to the water is to add Metamucil. This will help with satiety as well. It does contain calories, unlike water.  WHAT TYPES OF FAT SHOULD I CHOOSE?  Choose healthy fats more often. Choose monounsaturated and polyunsaturated fats, such as olive oil and canola oil, flaxseeds, walnuts, almonds, and seeds.  Eat more omega-3 fats. Good choices include salmon, mackerel, sardines, tuna, flaxseed oil, and ground flaxseeds. Aim to eat fish at least two times each week.  Avoid foods with partially hydrogenated oils in them. These contain trans fats. Examples of foods that contain trans fats are stick margarine, some tub margarines, cookies, crackers, and other baked goods. If you are going to avoid a fat, this is the one to avoid!  WHAT GENERAL GUIDELINES DO I NEED TO FOLLOW?  Check food labels carefully to identify foods with trans fats. Avoid these types of options when possible.  Fill one half of your plate with  vegetables and green salads. Eat 4-5 servings of vegetables per day. A serving of vegetables equals 1 cup of raw leafy vegetables,  cup of raw or cooked cut-up vegetables, or  cup of vegetable juice.  Fill one fourth of your plate with whole grains. Look for the word "whole" as the first word in the ingredient list.  Fill one fourth of your plate with lean protein foods.  Eat 4-5 servings of fruit per day. A serving of fruit equals one medium whole fruit,  cup of dried fruit,  cup of fresh, frozen, or canned fruit. Try to avoid fruits in cups/syrups as the sugar content can be high.  Eat more foods that contain soluble fiber. Examples of foods that contain this type of fiber are apples, broccoli, carrots, beans, peas, and barley. Aim to get 20-30 g of fiber per day.  Eat more home-cooked food and less restaurant, buffet, and fast food.  Limit or avoid alcohol.  Limit foods that are high in starch and sugar.  Avoid fried foods when able.  Cook foods by using methods other than frying. Baking, boiling, grilling, and broiling are all great options. Other fat-reducing suggestions include: ? Removing the skin from poultry. ? Removing all visible fats from meats. ? Skimming the fat off of stews, soups, and gravies before serving them. ? Steaming vegetables in water or broth.  Lose weight if you are overweight. Losing just 5-10% of your initial body weight can help your overall health and prevent diseases such as diabetes  and heart disease.  Increase your consumption of nuts, legumes, and seeds to 4-5 servings per week. One serving of dried beans or legumes equals  cup after being cooked, one serving of nuts equals 1 ounces, and one serving of seeds equals  ounce or 1 tablespoon.  WHAT ARE GOOD FOODS CAN I EAT? Grains Grainy breads (try to find bread that is 3 g of fiber per slice or greater), oatmeal, light popcorn. Whole-grain cereals. Rice and pasta, including brown rice and those  that are made with whole wheat. Edamame pasta is a great alternative to grain pasta. It has a higher protein content. Try to avoid significant consumption of white bread, sugary cereals, or pastries/baked goods.  Vegetables All vegetables. Cooked white potatoes do not count as vegetables.  Fruits All fruits, but limit pineapple and bananas as these fruits have a higher sugar content.  Meats and Other Protein Sources Lean, well-trimmed beef, veal, pork, and lamb. Chicken and [place] without skin. All fish and shellfish. Wild duck, rabbit, pheasant, and venison. Egg whites or low-cholesterol egg substitutes. Dried beans, peas, lentils, and tofu.Seeds and most nuts.  Dairy Low-fat or nonfat cheeses, including ricotta, string, and mozzarella. Skim or 1% milk that is liquid, powdered, or evaporated. Buttermilk that is made with low-fat milk. Nonfat or low-fat yogurt. Soy/Almond milk are good alternatives if you cannot handle dairy.  Beverages Water is the best for you. Sports drinks with less sugar are more desirable unless you are a highly active athlete.  Sweets and Desserts Sherbets and fruit ices. Honey, jam, marmalade, jelly, and syrups. Dark chocolate.  Eat all sweets and desserts in moderation.  Fats and Oils Nonhydrogenated (trans-free) margarines. Vegetable oils, including soybean, sesame, sunflower, olive, peanut, safflower, corn, canola, and cottonseed. Salad dressings or mayonnaise that are made with a vegetable oil. Limit added fats and oils that you use for cooking, baking, salads, and as spreads.  Other Cocoa powder. Coffee and tea. Most condiments.  The items listed above may not be a complete list of recommended foods or beverages. Contact your dietitian for more options.

## 2018-02-04 NOTE — Progress Notes (Signed)
Chief Complaint  Patient presents with  . Follow-up    anxiety medication    Subjective Amy Mccall presents for f/u anxiety/depression.  She is currently being treated with Effexor 75 mg/d.  Reports good progress since treatment. Pt is following with a counselor/psychologist.  Hx of thyroid issue. Follows with endo. Pt has been gaining weight. Not overly physically active and her diet is overall poor. She knows she needs to start working towards losing wt.    ROS Psych: No homicidal or suicidal thoughts  Past Medical History:  Diagnosis Date  . DVT (deep venous thrombosis) ([place]) 2014   turned into PE  . Generalized anxiety disorder    Follows with a Social worker  . GERD (gastroesophageal reflux disease)   . PE (pulmonary thromboembolism) ([place]) 2014  . Seasonal allergies   . Thyroid cancer ([place])    Removed thyroid in Aug, 2017, radiation in October   Exam BP 120/86 (BP Location: Left Arm, Patient Position: Sitting, Cuff Size: Large)   Pulse 84   Temp 99.2 F (37.3 C) (Oral)   Ht 5\' 7"  (1.702 m)   Wt 243 lb (110.2 kg)   SpO2 96%   BMI 38.06 kg/m  General:  well developed, well nourished, in no apparent distress Lungs:  clear to auscultation, breath sounds equal bilaterally, no respiratory distress Cardio:  regular rate and rhythm without murmurs, heart sounds without clicks or rubs Psych: well oriented with normal range of affect and age-appropriate judgement/insight, alert and oriented x4.  Assessment and Plan  GAD (generalized anxiety disorder)  Obesity (BMI 30-39.9)  Need for influenza vaccination - Plan: Flu Vaccine QUAD 6+ mos PF IM (Fluarix Quad PF)  Cont Effexor. Cont w counseling. Counseled on diet and exercise. Pt was given healthy diet handout. Goal wt long term is 65 lb wt loss and 25 lbs by her 6 mo visit. F/u in 6 mo for CPE. The patient voiced understanding and agreement to the plan.  [place], DO 02/04/18 4:35 PM

## 2018-02-04 NOTE — Progress Notes (Signed)
Pre visit review using our clinic review tool, if applicable. No additional management support is needed unless otherwise documented below in the visit note. 

## 2018-02-14 DIAGNOSIS — H60502 Unspecified acute noninfective otitis externa, left ear: Secondary | ICD-10-CM | POA: Diagnosis not present

## 2018-02-14 DIAGNOSIS — J018 Other acute sinusitis: Secondary | ICD-10-CM | POA: Diagnosis not present

## 2018-03-15 ENCOUNTER — Other Ambulatory Visit: Payer: Self-pay | Admitting: Family Medicine

## 2018-03-15 DIAGNOSIS — F411 Generalized anxiety disorder: Secondary | ICD-10-CM

## 2018-04-26 DIAGNOSIS — C73 Malignant neoplasm of thyroid gland: Secondary | ICD-10-CM | POA: Diagnosis not present

## 2018-06-14 DIAGNOSIS — L814 Other melanin hyperpigmentation: Secondary | ICD-10-CM | POA: Diagnosis not present

## 2018-08-16 ENCOUNTER — Encounter: Payer: 59 | Admitting: Family Medicine

## 2018-09-07 ENCOUNTER — Other Ambulatory Visit: Payer: Self-pay | Admitting: Family Medicine

## 2018-09-07 DIAGNOSIS — F411 Generalized anxiety disorder: Secondary | ICD-10-CM

## 2018-09-08 ENCOUNTER — Telehealth: Payer: Self-pay | Admitting: Family Medicine

## 2018-09-08 ENCOUNTER — Other Ambulatory Visit: Payer: Self-pay | Admitting: Family Medicine

## 2018-09-08 DIAGNOSIS — Z1231 Encounter for screening mammogram for malignant neoplasm of breast: Secondary | ICD-10-CM | POA: Diagnosis not present

## 2018-09-08 DIAGNOSIS — F411 Generalized anxiety disorder: Secondary | ICD-10-CM

## 2018-09-08 NOTE — Telephone Encounter (Signed)
Copied from [place] [id number]. Topic: Quick Communication - Rx Refill/Question >> Sep 08, 2018  3:47 PM Leward Quan A wrote: Medication: venlafaxine XR (EFFEXOR-XR) 75 MG 24 hr capsule  Has the patient contacted their pharmacy? Yes.   (Agent: If no, request that the patient contact the pharmacy for the refill.) (Agent: If yes, when and what did the pharmacy advise?)  Preferred Pharmacy (with phone number or street name): [facility], [place] - [address] [phone] (Phone) [phone] (Fax)   Agent: Please be advised that RX refills may take up to 3 business days. We ask that you follow-up with your pharmacy.

## 2018-09-08 NOTE — Telephone Encounter (Signed)
Copied from [place] [id number]. Topic: Quick Communication - Rx Refill/Question >> Sep 08, 2018  3:47 PM Leward Quan A wrote: Medication: venlafaxine XR (EFFEXOR-XR) 75 MG 24 hr capsule  Has the patient contacted their pharmacy? Yes.   (Agent: If no, request that the patient contact the pharmacy for the refill.) (Agent: If yes, when and what did the pharmacy advise?)  Preferred Pharmacy (with phone number or street name): [facility], [place] - [address] [phone] (Phone) [phone] (Fax)   Agent: Please be advised that RX refills may take up to 3 business days. We ask that you follow-up with your pharmacy.

## 2018-09-08 NOTE — Telephone Encounter (Signed)
Duplicate note

## 2018-09-09 MED ORDER — VENLAFAXINE HCL ER 75 MG PO CP24: ORAL_CAPSULE | ORAL | 1 refills | Status: DC

## 2018-09-09 NOTE — Telephone Encounter (Signed)
Refill sent to pharmacy.   

## 2018-09-10 ENCOUNTER — Ambulatory Visit (INDEPENDENT_AMBULATORY_CARE_PROVIDER_SITE_OTHER): Payer: 59 | Admitting: Family Medicine

## 2018-09-10 ENCOUNTER — Encounter: Payer: Self-pay | Admitting: Family Medicine

## 2018-09-10 ENCOUNTER — Other Ambulatory Visit: Payer: Self-pay

## 2018-09-10 DIAGNOSIS — F411 Generalized anxiety disorder: Secondary | ICD-10-CM | POA: Diagnosis not present

## 2018-09-10 MED ORDER — VENLAFAXINE HCL ER 75 MG PO CP24
ORAL_CAPSULE | ORAL | 1 refills | Status: DC
Start: 2018-09-10 — End: 2019-02-23

## 2018-09-10 MED ORDER — VENLAFAXINE HCL ER 37.5 MG PO CP24: ORAL_CAPSULE | ORAL | 1 refills | Status: DC

## 2018-09-10 NOTE — Progress Notes (Signed)
Chief Complaint  Patient presents with  . Anxiety    Subjective Amy Mccall presents for f/u anxiety/depression. Due to COVID-19 pandemic, we are interacting via web portal for an electronic face-to-face visit. I verified patient's ID using 2 identifiers. Patient agreed to proceed with visit via this method. Patient is at home, I am at office. Patient and I are present for visit.   She is currently being treated with Effexor 75.  Reports doing well, but pandemic has caused issues w increased anxiety/stress due to changes that are being asked of society. Compliant, no AE's.  No thoughts of harming self or others. No self-medication with alcohol, prescription drugs or illicit drugs. Pt is following with a counselor/psychologist, very helpful.   ROS Psych: No homicidal or suicidal thoughts  Past Medical History:  Diagnosis Date  . Generalized anxiety disorder    Follows with a Social worker  . GERD (gastroesophageal reflux disease)   . PE (pulmonary thromboembolism) ([place]) 2014   Started with DVT  . Seasonal allergies   . Thyroid cancer ([place])    Removed thyroid in Aug, 2017, radiation in October   Exam No conversational dyspnea Age appropriate judgment and insight Nml affect and mood  Assessment and Plan  GAD (generalized anxiety disorder) - Plan: venlafaxine XR (EFFEXOR-XR) 75 MG 24 hr capsule, venlafaxine XR (EFFEXOR-XR) 37.5 MG 24 hr capsule  Increase dose from 75 mg/d to 112.5 mg/d. Cont with counseling.  F/u in 3 mo or prn. The patient voiced understanding and agreement to the plan.  [place], DO 09/10/18 3:38 PM

## 2018-12-10 ENCOUNTER — Other Ambulatory Visit: Payer: Self-pay | Admitting: Family Medicine

## 2018-12-10 DIAGNOSIS — F411 Generalized anxiety disorder: Secondary | ICD-10-CM

## 2019-02-18 ENCOUNTER — Ambulatory Visit: Payer: 59

## 2019-02-23 ENCOUNTER — Other Ambulatory Visit: Payer: Self-pay

## 2019-02-23 ENCOUNTER — Encounter: Payer: Self-pay | Admitting: Family Medicine

## 2019-02-23 ENCOUNTER — Ambulatory Visit: Payer: 59 | Admitting: Family Medicine

## 2019-02-23 VITALS — BP 127/66 | HR 87 | Temp 97.1°F | Resp 16 | Ht 67.0 in | Wt 240.0 lb

## 2019-02-23 DIAGNOSIS — Z23 Encounter for immunization: Secondary | ICD-10-CM

## 2019-02-23 DIAGNOSIS — E669 Obesity, unspecified: Secondary | ICD-10-CM

## 2019-02-23 DIAGNOSIS — Z0000 Encounter for general adult medical examination without abnormal findings: Secondary | ICD-10-CM | POA: Diagnosis not present

## 2019-02-23 DIAGNOSIS — F411 Generalized anxiety disorder: Secondary | ICD-10-CM | POA: Diagnosis not present

## 2019-02-23 MED ORDER — VENLAFAXINE HCL ER 75 MG PO CP24: ORAL_CAPSULE | ORAL | 3 refills | Status: DC

## 2019-02-23 MED ORDER — VENLAFAXINE HCL ER 37.5 MG PO CP24: ORAL_CAPSULE | ORAL | 3 refills | Status: DC

## 2019-02-23 NOTE — Patient Instructions (Addendum)
Give us 2-3 business days to get the results of your labs back.  ° °Keep the diet clean and stay active. ° °Aim to do some physical exertion for 150 minutes per week. This is typically divided into 5 days per week, 30 minutes per day. The activity should be enough to get your heart rate up. Anything is better than nothing if you have time constraints. ° °If you do not hear anything about your referral in the next 1-2 weeks, call our office and ask for an update. ° °Let us know if you need anything. ° °Healthy Eating Plan °Many factors influence your heart health, including eating and exercise habits. Heart (coronary) risk increases with abnormal blood fat (lipid) levels. Heart-healthy meal planning includes limiting unhealthy fats, increasing healthy fats, and making other small dietary changes. This includes maintaining a healthy body weight to help keep lipid levels within a normal range. ° °WHAT IS MY PLAN?  °Your health care provider recommends that you: °Drink a glass of water before meals to help with satiety. °Eat slowly. °An alternative to the water is to add Metamucil. This will help with satiety as well. It does contain calories, unlike water. ° °WHAT TYPES OF FAT SHOULD I CHOOSE? °Choose healthy fats more often. Choose monounsaturated and polyunsaturated fats, such as olive oil and canola oil, flaxseeds, walnuts, almonds, and seeds. °Eat more omega-3 fats. Good choices include salmon, mackerel, sardines, tuna, flaxseed oil, and ground flaxseeds. Aim to eat fish at least two times each week. °Avoid foods with partially hydrogenated oils in them. These contain trans fats. Examples of foods that contain trans fats are stick margarine, some tub margarines, cookies, crackers, and other baked goods. If you are going to avoid a fat, this is the one to avoid! ° °WHAT GENERAL GUIDELINES DO I NEED TO FOLLOW? °Check food labels carefully to identify foods with trans fats. Avoid these types of options when  possible. °Fill one half of your plate with vegetables and green salads. Eat 4-5 servings of vegetables per day. A serving of vegetables equals 1 cup of raw leafy vegetables, ½ cup of raw or cooked cut-up vegetables, or ½ cup of vegetable juice. °Fill one fourth of your plate with whole grains. Look for the word "whole" as the first word in the ingredient list. °Fill one fourth of your plate with lean protein foods. °Eat 4-5 servings of fruit per day. A serving of fruit equals one medium whole fruit, ¼ cup of dried fruit, ½ cup of fresh, frozen, or canned fruit. Try to avoid fruits in cups/syrups as the sugar content can be high. °Eat more foods that contain soluble fiber. Examples of foods that contain this type of fiber are apples, broccoli, carrots, beans, peas, and barley. Aim to get 20-30 g of fiber per day. °Eat more home-cooked food and less restaurant, buffet, and fast food. °Limit or avoid alcohol. °Limit foods that are high in starch and sugar. °Avoid fried foods when able. °Cook foods by using methods other than frying. Baking, boiling, grilling, and broiling are all great options. Other fat-reducing suggestions include: °Removing the skin from poultry. °Removing all visible fats from meats. °Skimming the fat off of stews, soups, and gravies before serving them. °Steaming vegetables in water or broth. °Lose weight if you are overweight. Losing just 5-10% of your initial body weight can help your overall health and prevent diseases such as diabetes and heart disease. °Increase your consumption of nuts, legumes, and seeds to 4-5 servings   per week. One serving of dried beans or legumes equals ½ cup after being cooked, one serving of nuts equals 1½ ounces, and one serving of seeds equals ½ ounce or 1 tablespoon. ° °WHAT ARE GOOD FOODS CAN I EAT? °Grains °Grainy breads (try to find bread that is 3 g of fiber per slice or greater), oatmeal, light popcorn. Whole-grain cereals. Rice and pasta, including brown  rice and those that are made with whole wheat. Edamame pasta is a great alternative to grain pasta. It has a higher protein content. Try to avoid significant consumption of white bread, sugary cereals, or pastries/baked goods. ° °Vegetables °All vegetables. Cooked white potatoes do not count as vegetables. ° °Fruits °All fruits, but limit pineapple and bananas as these fruits have a higher sugar content. ° °Meats and Other Protein Sources °Lean, well-trimmed beef, veal, pork, and lamb. Chicken and turkey without skin. All fish and shellfish. Wild duck, rabbit, pheasant, and venison. Egg whites or low-cholesterol egg substitutes. Dried beans, peas, lentils, and tofu. Seeds and most nuts. ° °Dairy °Low-fat or nonfat cheeses, including ricotta, string, and mozzarella. Skim or 1% milk that is liquid, powdered, or evaporated. Buttermilk that is made with low-fat milk. Nonfat or low-fat yogurt. Soy/Almond milk are good alternatives if you cannot handle dairy. ° °Beverages °Water is the best for you. Sports drinks with less sugar are more desirable unless you are a highly active athlete. ° °Sweets and Desserts °Sherbets and fruit ices. Honey, jam, marmalade, jelly, and syrups. Dark chocolate.  °Eat all sweets and desserts in moderation. ° °Fats and Oils °Nonhydrogenated (trans-free) margarines. Vegetable oils, including soybean, sesame, sunflower, olive, peanut, safflower, corn, canola, and cottonseed. Salad dressings or mayonnaise that are made with a vegetable oil. Limit added fats and oils that you use for cooking, baking, salads, and as spreads. ° °Other °Cocoa powder. Coffee and tea. Most condiments. ° °The items listed above may not be a complete list of recommended foods or beverages. Contact your dietitian for more options. °

## 2019-02-23 NOTE — Progress Notes (Signed)
Chief Complaint  Patient presents with  . Anxiety    follow up     Well Woman Amy Mccall is here for a complete physical.   Her last physical was >1 year ago.  Current diet: in general, diet is fair. Current exercise: none. Weight is stable and she denies awake time fatigue. No LMP recorded. (Menstrual status: Irregular Periods). Seatbelt? Yes  Health Maintenance Pap/HPV- Yes Mammogram- Yes Tetanus- Yes HIV screening- Yes  Past Medical History:  Diagnosis Date  . Generalized anxiety disorder    Follows with a Social worker  . GERD (gastroesophageal reflux disease)   . PE (pulmonary thromboembolism) ([place]) 2014   Started with DVT  . Seasonal allergies   . Thyroid cancer [facility])    Removed thyroid in Aug, 2017, radiation in October     Past Surgical History:  Procedure Laterality Date  . BIOPSY THYROID    . CHOLECYSTECTOMY    . THYROIDECTOMY  2017    Medications  Current Outpatient Medications on File Prior to Visit  Medication Sig Dispense Refill  . aspirin EC 81 MG tablet Take 81 mg by mouth daily.    . cetirizine (ZYRTEC) 10 MG tablet Take 10 mg by mouth daily as needed for allergies.    . cholecalciferol (VITAMIN D) 1000 units tablet Take 1,000 Units by mouth daily.    Marland Kitchen esomeprazole (NEXIUM) 20 MG capsule Take 20 mg by mouth daily at 12 noon.    Marland Kitchen levothyroxine (SYNTHROID, LEVOTHROID) 137 MCG tablet Take 137 mcg by mouth daily before breakfast.    . norethindrone (MICRONOR,CAMILA,ERRIN) 0.35 MG tablet Take 1 tablet by mouth daily.    Marland Kitchen venlafaxine XR (EFFEXOR-XR) 37.5 MG 24 hr capsule TAKE 1 CAPSULE BY MOUTH EACH DAY ALONG WITH 75MG  CAPSULE 90 capsule 0  . venlafaxine XR (EFFEXOR-XR) 75 MG 24 hr capsule Take 1 capsule by mouth daily with the 37.5 mg cap for a total of 112.5 mg. 90 capsule 1   Allergies Allergies  Allergen Reactions  . 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 Penicillins Rash    Childhood allergy Childhood allergy Childhood allergy    Review of Systems: Constitutional:  no unexpected weight changes Eye:  no recent significant change in vision Ear/Nose/Mouth/Throat:  Ears:  no recent change in hearing Nose/Mouth/Throat:  no complaints of nasal congestion, no sore throat Cardiovascular: no chest pain Respiratory:  no shortness of breath Gastrointestinal:  no abdominal pain, no change in bowel habits GU:  Female: negative for dysuria or pelvic pain Musculoskeletal/Extremities:  no pain of the joints Integumentary (Skin/Breast):  no abnormal skin lesions reported Neurologic:  no headaches Endocrine:  denies fatigue Hematologic/Lymphatic:  No areas of easy bleeding  Exam BP 127/66 (BP Location: Right Arm, Patient Position: Sitting, Cuff Size: Large)   Pulse 87   Temp (!) 97.1 F (36.2 C) (Temporal)   Resp 16   Ht 5\' 7"  (1.702 m)   Wt 240 lb (108.9 kg)   SpO2 98%   BMI 37.59 kg/m  General:  well developed, well nourished, in no apparent distress Skin:  no significant moles, warts, or growths Head:  no masses, lesions, or tenderness Eyes:  pupils equal and round, sclera anicteric without injection Ears:  canals without lesions, TMs shiny without retraction, no obvious effusion, no erythema Nose:  nares patent, septum midline, mucosa normal, and no drainage or sinus tenderness Throat/Pharynx:  lips and gingiva without lesion; tongue and uvula midline; non-inflamed pharynx; no exudates or postnasal drainage Neck: neck supple without adenopathy, thyromegaly, or masses Lungs:  clear to auscultation, breath sounds equal bilaterally, no respiratory distress Cardio:  regular rate and rhythm, no LE edema Abdomen:  abdomen soft, nontender; bowel sounds normal; no masses or organomegaly Genital: Defer to GYN Musculoskeletal:  symmetrical muscle groups noted without atrophy or deformity Extremities:  no  clubbing, cyanosis, or edema, no deformities, no skin discoloration Neuro:  gait normal; deep tendon reflexes normal and symmetric Psych: well oriented with normal range of affect and appropriate judgment/insight  Assessment and Plan  Well adult exam - Plan: CBC, Comprehensive metabolic panel, Hemoglobin A1c, Lipid panel  Needs flu shot - Plan: Flu Vaccine QUAD 6+ mos PF IM (Fluarix Quad PF)  Obesity (BMI 30-39.9) - Plan: Amb Ref to Medical Weight Management  GAD (generalized anxiety disorder) - Plan: venlafaxine XR (EFFEXOR-XR) 37.5 MG 24 hr capsule, venlafaxine XR (EFFEXOR-XR) 75 MG 24 hr capsule   Well 43 y.o. female. Counseled on diet and exercise. Follows w GYN. Other orders as above. Follow up in 1 yr or prn. The patient voiced understanding and agreement to the plan.  [place], DO 02/23/19 4:47 PM

## 2019-02-24 ENCOUNTER — Other Ambulatory Visit: Payer: Self-pay | Admitting: *Deleted

## 2019-02-24 ENCOUNTER — Ambulatory Visit: Payer: 59

## 2019-02-24 DIAGNOSIS — E162 Hypoglycemia, unspecified: Secondary | ICD-10-CM

## 2019-02-24 DIAGNOSIS — E7800 Pure hypercholesterolemia, unspecified: Secondary | ICD-10-CM

## 2019-02-24 DIAGNOSIS — R739 Hyperglycemia, unspecified: Secondary | ICD-10-CM

## 2019-02-24 LAB — COMPREHENSIVE METABOLIC PANEL
ALT: 21 U/L (ref 0–35)
AST: 17 U/L (ref 0–37)
Albumin: 4.1 g/dL (ref 3.5–5.2)
Alkaline Phosphatase: 103 U/L (ref 39–117)
BUN: 9 mg/dL (ref 6–23)
CO2: 27 mEq/L (ref 19–32)
Calcium: 8.9 mg/dL (ref 8.4–10.5)
Chloride: 104 mEq/L (ref 96–112)
Creatinine, Ser: 0.9 mg/dL (ref 0.40–1.20)
GFR: 68.38 mL/min (ref >60.00)
Glucose, Bld: 109 mg/dL — ABNORMAL HIGH (ref 70–99)
Potassium: 3.7 mEq/L (ref 3.5–5.1)
Sodium: 139 mEq/L (ref 135–145)
Total Bilirubin: 0.9 mg/dL (ref 0.2–1.2)
Total Protein: 7.3 g/dL (ref 6.0–8.3)

## 2019-02-24 LAB — LIPID PANEL
Cholesterol: 193 mg/dL (ref 0–200)
HDL: 52.7 mg/dL (ref >39.00)
LDL Cholesterol: 124 mg/dL — ABNORMAL HIGH (ref 0–99)
NonHDL: 140.1
Total CHOL/HDL Ratio: 4
Triglycerides: 83 mg/dL (ref 0.0–149.0)
VLDL: 16.6 mg/dL (ref 0.0–40.0)

## 2019-02-24 LAB — CBC
HCT: 37.6 % (ref 36.0–46.0)
Hemoglobin: 12.1 g/dL (ref 12.0–15.0)
MCHC: 32.2 g/dL (ref 30.0–36.0)
MCV: 80.6 fl (ref 78.0–100.0)
Platelets: 357 10*3/uL (ref 150.0–400.0)
RBC: 4.67 Mil/uL (ref 3.87–5.11)
RDW: 16.3 % — ABNORMAL HIGH (ref 11.5–15.5)
WBC: 8.3 10*3/uL (ref 4.0–10.5)

## 2019-02-24 LAB — HEMOGLOBIN A1C: Hgb A1c MFr Bld: 6 % (ref 4.6–6.5)

## 2019-03-13 ENCOUNTER — Encounter: Payer: Self-pay | Admitting: Family Medicine

## 2019-05-27 ENCOUNTER — Other Ambulatory Visit: Payer: Self-pay

## 2019-05-27 ENCOUNTER — Encounter: Payer: Self-pay | Admitting: Medical

## 2019-05-27 ENCOUNTER — Ambulatory Visit: Payer: 59 | Admitting: Medical

## 2019-05-27 VITALS — BP 152/90 | HR 92 | Temp 97.4°F | Resp 17 | Ht 67.0 in | Wt 236.0 lb

## 2019-05-27 DIAGNOSIS — R252 Cramp and spasm: Secondary | ICD-10-CM

## 2019-05-27 DIAGNOSIS — M79661 Pain in right lower leg: Secondary | ICD-10-CM

## 2019-05-27 LAB — COMPREHENSIVE METABOLIC PANEL
AG Ratio: 1.4 (calc) (ref 1.0–2.5)
ALT: 16 U/L (ref 6–29)
AST: 14 U/L (ref 10–30)
Albumin: 4 g/dL (ref 3.6–5.1)
Alkaline phosphatase (APISO): 90 U/L (ref 31–125)
BUN: 10 mg/dL (ref 7–25)
CO2: 27 mmol/L (ref 20–32)
Calcium: 9 mg/dL (ref 8.6–10.2)
Chloride: 104 mmol/L (ref 98–110)
Creat: 0.9 mg/dL (ref 0.50–1.10)
Globulin: 2.9 g/dL (calc) (ref 1.9–3.7)
Glucose, Bld: 102 mg/dL — ABNORMAL HIGH (ref 65–99)
Potassium: 3.5 mmol/L (ref 3.5–5.3)
Sodium: 139 mmol/L (ref 135–146)
Total Bilirubin: 0.7 mg/dL (ref 0.2–1.2)
Total Protein: 6.9 g/dL (ref 6.1–8.1)

## 2019-05-27 LAB — MAGNESIUM: Magnesium: 2 mg/dL (ref 1.5–2.5)

## 2019-05-27 NOTE — Progress Notes (Signed)
Subjective:    Patient ID: Amy Mccall, female    DOB: [birth year], 44 y.o.   MRN: [id number]  HPI  Pt in for some rt lower ext pain. She states 2 nights ago at work started to get slight cramp to her leg. Then started to get some slight constant rt upper calf tenderness. She started to feel some fullness to back of her leg.  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  Pt has no recent trauma, non smoker and  on progesterone only. Pt did travel to [place] 6 weeks ago by car.   Pt has no sob.   For prior dvt was on xarelto briefly in past then determined does not need. Pt had IVC placed years ago and then removed.   Review of Systems  Constitutional: Negative for chills and fatigue.  Respiratory: Negative for chest tightness, shortness of breath and wheezing.   Cardiovascular: Negative for chest pain and palpitations.  Gastrointestinal: Negative for abdominal pain.  Genitourinary: Negative for dysuria.  Musculoskeletal:       Rt calf pain.  Skin: Negative for rash.  Neurological: Negative for dizziness, speech difficulty, weakness and light-headedness.  Hematological: Negative for adenopathy. Does not bruise/bleed easily.  Psychiatric/Behavioral: Negative for behavioral problems and decreased concentration.    Past Medical History:  Diagnosis Date  . Generalized anxiety disorder    Follows with a Social worker  . GERD (gastroesophageal reflux disease)   . PE (pulmonary thromboembolism) ([place]) 2014   Started with DVT  . Seasonal allergies   . Thyroid cancer [facility])    Removed thyroid in Aug, 2017, radiation in October     Social History   Socioeconomic History  . Marital status: Married    Spouse name: Not on file  . Number of children: Not on file  . Years of education: Not on file  . Highest education level: Not on file  Occupational History  . Not on file  Tobacco Use  . Smoking status: Never Smoker  . Smokeless tobacco: Never Used  Substance and Sexual Activity  .  Alcohol use: No  . Drug use: No  . Sexual activity: Never    Birth control/protection: Pill  Other Topics Concern  . Not on file  Social History Narrative  . Not on file   Social Determinants of Health   Financial Resource Strain:   . Difficulty of Paying Living Expenses: Not on file  Food Insecurity:   . Worried About Charity fundraiser in the Last Year: Not on file  . Ran Out of Food in the Last Year: Not on file  Transportation Needs:   . Lack of Transportation (Medical): Not on file  . Lack of Transportation (Non-Medical): Not on file  Physical Activity:   . Days of Exercise per Week: Not on file  . Minutes of Exercise per Session: Not on file  Stress:   . Feeling of Stress : Not on file  Social Connections:   . Frequency of Communication with Friends and Family: Not on file  . Frequency of Social Gatherings with Friends and Family: Not on file  . Attends Religious Services: Not on file  . Active Member of Clubs or Organizations: Not on file  . Attends Archivist Meetings: Not on file  . Marital Status: Not on file  Intimate Partner Violence:   . Fear of Current or Ex-Partner: Not on file  . Emotionally Abused: Not on file  . Physically Abused:  Not on file  . Sexually Abused: Not on file    Past Surgical History:  Procedure Laterality Date  . BIOPSY THYROID    . CHOLECYSTECTOMY    . THYROIDECTOMY  2017    Family History  Problem Relation Age of Onset  . Osteoporosis Mother   . COPD Father   . Diabetes Father   . Heart disease Father   . Colon cancer Neg Hx     Allergies  Allergen Reactions  . 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 Penicillins Rash    Childhood allergy Childhood allergy Childhood allergy    Current Outpatient Medications on File Prior to Visit  Medication Sig Dispense Refill  . aspirin EC 81  MG tablet Take 81 mg by mouth daily.    . cetirizine (ZYRTEC) 10 MG tablet Take 10 mg by mouth daily as needed for allergies.    . cholecalciferol (VITAMIN D) 1000 units tablet Take 1,000 Units by mouth daily.    Marland Kitchen esomeprazole (NEXIUM) 20 MG capsule Take 20 mg by mouth daily at 12 noon.    Marland Kitchen levothyroxine (SYNTHROID, LEVOTHROID) 137 MCG tablet Take 137 mcg by mouth daily before breakfast.    . norethindrone (MICRONOR,CAMILA,ERRIN) 0.35 MG tablet Take 1 tablet by mouth daily.    Marland Kitchen venlafaxine XR (EFFEXOR-XR) 37.5 MG 24 hr capsule TAKE 1 CAPSULE BY MOUTH EACH DAY ALONG WITH 75MG  CAPSULE 90 capsule 3  . venlafaxine XR (EFFEXOR-XR) 75 MG 24 hr capsule Take 1 capsule by mouth daily with the 37.5 mg cap for a total of 112.5 mg. 90 capsule 3   No current facility-administered medications on file prior to visit.    BP (!) 152/90 (BP Location: Left Arm, Patient Position: Sitting, Cuff Size: Large)   Pulse 92   Temp (!) 97.4 F (36.3 C) (Temporal)   Resp 17   Ht 5\' 7"  (1.702 m)   Wt 236 lb (107 kg)   SpO2 98%   BMI 36.96 kg/m       Objective:   Physical Exam   General- No acute distress. Pleasant patient. Neck- Full range of motion, no jvd Lungs- Clear, even and unlabored. Heart- regular rate and rhythm. Neurologic- CNII- XII grossly intact.  Rt lower ext- no pain in popliteal area on homans test but mid calf tenderness. On inspection has appearance of popliteal. Rt and left calfs are symmetric.     Assessment & Plan:  For your recent right lower extremity/calf area pain, I put in orders for you to have right lower extremity ultrasound today.  I did call downstairs trying to get you to have the study done today but radiology notified me know so is available today.  So we will get you scheduled for tomorrow morning at 11 AM.  You need register at the emergency department exit but clearly explained to them that you have an appointment to get ultrasound to radiology  department.  Starting tomorrow is not ideal but I do think is reasonable.  However if you have calf swelling or any shortness of breath then recommend getting the study tonight through the emergency department.  You can also go ahead and get metabolic panel and magnesium level through our lab since she reports some cramping presently.  I want you to stay in radiology tomorrow until notified of the ultrasound report results.  If the study comes back positive for DVT  then will need to put you on Xarelto.  Follow-up date to be determined pending ultrasound results.

## 2019-05-27 NOTE — Patient Instructions (Signed)
For your recent right lower extremity/calf area pain, I put in orders for you to have right lower extremity ultrasound today.  I did call downstairs trying to get you to have the study done today but radiology notified me know so is available today.  So we will get you scheduled for tomorrow morning at 11 AM.  You need register at the emergency department exit but clearly explained to them that you have an appointment to get ultrasound to radiology department.  Starting tomorrow is not ideal but I do think is reasonable.  However if you have calf swelling or any shortness of breath then recommend getting the study tonight through the emergency department.  You can also go ahead and get metabolic panel and magnesium level through our lab since she reports some cramping presently.  I want you to stay in radiology tomorrow until notified of the ultrasound report results.  If the study comes back positive for DVT then will need to put you on Xarelto.  Follow-up date to be determined pending ultrasound results.

## 2019-05-28 ENCOUNTER — Other Ambulatory Visit: Payer: Self-pay

## 2019-05-28 ENCOUNTER — Ambulatory Visit (HOSPITAL_BASED_OUTPATIENT_CLINIC_OR_DEPARTMENT_OTHER)
Admission: RE | Admit: 2019-05-28 | Discharge: 2019-05-28 | Disposition: A | Discharge status: Home / Self Care | Payer: 59 | Source: Ambulatory Visit | Attending: Medical | Admitting: Medical

## 2019-05-28 DIAGNOSIS — M79661 Pain in right lower leg: Secondary | ICD-10-CM | POA: Diagnosis not present

## 2019-08-29 ENCOUNTER — Other Ambulatory Visit: Payer: 59

## 2019-09-30 ENCOUNTER — Telehealth: Payer: Self-pay | Admitting: *Deleted

## 2019-09-30 NOTE — Telephone Encounter (Signed)
Looks like on 02/23/20 she was to schedule a return lab in 6 months for lipid and hgba1c. Ok to order? She has a lab appt on Tuesday June 1

## 2019-09-30 NOTE — Telephone Encounter (Signed)
Pt has lab appt on Tuesday but I do not see any future orders in Epic.  Can you place orders if appropriate?

## 2019-09-30 NOTE — Telephone Encounter (Signed)
Yes, ok to order. Ty.

## 2019-10-04 ENCOUNTER — Other Ambulatory Visit: Payer: Self-pay

## 2019-10-04 ENCOUNTER — Other Ambulatory Visit: Payer: Self-pay | Admitting: Family Medicine

## 2019-10-04 ENCOUNTER — Other Ambulatory Visit (INDEPENDENT_AMBULATORY_CARE_PROVIDER_SITE_OTHER): Payer: 59

## 2019-10-04 DIAGNOSIS — R739 Hyperglycemia, unspecified: Secondary | ICD-10-CM | POA: Diagnosis not present

## 2019-10-04 DIAGNOSIS — E7800 Pure hypercholesterolemia, unspecified: Secondary | ICD-10-CM

## 2019-10-04 LAB — LIPID PANEL
Cholesterol: 198 mg/dL (ref 0–200)
HDL: 56 mg/dL (ref >39.00)
LDL Cholesterol: 126 mg/dL — ABNORMAL HIGH (ref 0–99)
NonHDL: 142.03
Total CHOL/HDL Ratio: 4
Triglycerides: 79 mg/dL (ref 0.0–149.0)
VLDL: 15.8 mg/dL (ref 0.0–40.0)

## 2019-10-04 LAB — HEMOGLOBIN A1C: Hgb A1c MFr Bld: 6 % (ref 4.6–6.5)

## 2019-10-04 NOTE — Telephone Encounter (Signed)
Labs ordered.

## 2019-11-28 ENCOUNTER — Other Ambulatory Visit: Payer: Self-pay

## 2019-11-28 ENCOUNTER — Other Ambulatory Visit: Payer: Self-pay | Admitting: Family Medicine

## 2019-11-28 DIAGNOSIS — F411 Generalized anxiety disorder: Secondary | ICD-10-CM

## 2019-11-28 MED ORDER — VENLAFAXINE HCL ER 75 MG PO CP24: ORAL_CAPSULE | ORAL | 1 refills | Status: DC

## 2020-02-27 ENCOUNTER — Other Ambulatory Visit: Payer: Self-pay

## 2020-02-27 ENCOUNTER — Encounter: Payer: Self-pay | Admitting: Family Medicine

## 2020-02-27 ENCOUNTER — Ambulatory Visit (INDEPENDENT_AMBULATORY_CARE_PROVIDER_SITE_OTHER): Payer: 59 | Admitting: Family Medicine

## 2020-02-27 VITALS — BP 132/76 | HR 87 | Temp 98.8°F | Ht 66.0 in | Wt 239.1 lb

## 2020-02-27 DIAGNOSIS — R7303 Prediabetes: Secondary | ICD-10-CM | POA: Diagnosis not present

## 2020-02-27 DIAGNOSIS — Z1159 Encounter for screening for other viral diseases: Secondary | ICD-10-CM

## 2020-02-27 DIAGNOSIS — Z0000 Encounter for general adult medical examination without abnormal findings: Secondary | ICD-10-CM

## 2020-02-27 DIAGNOSIS — F411 Generalized anxiety disorder: Secondary | ICD-10-CM

## 2020-02-27 DIAGNOSIS — Z23 Encounter for immunization: Secondary | ICD-10-CM | POA: Diagnosis not present

## 2020-02-27 MED ORDER — VENLAFAXINE HCL ER 75 MG PO CP24: 75.0000 mg | ORAL_CAPSULE | Freq: Every day | ORAL | 3 refills | Status: DC

## 2020-02-27 NOTE — Progress Notes (Signed)
Chief Complaint  Patient presents with  . Annual Exam     Well Woman Amy Mccall is here for a complete physical.   Her last physical was >1 year ago.  Current diet: in general, a "healthy" diet. Current exercise: nothing consistent. Weight is stable and she denies fatigue out of ordinary. Seatbelt? Yes  Health Maintenance Pap/HPV- Yes Mammogram- Yes Tetanus- Yes Hep C screening- No HIV screening- Yes   GAD Currently on 112.5 mg/d Effexor daily. Reports compliance. Doing very well. Would like to go back down to 75 mg/d. No SI or HI. No self medication.   Past Medical History:  Diagnosis Date  . Generalized anxiety disorder    Follows with a Social worker  . GERD (gastroesophageal reflux disease)   . PE (pulmonary thromboembolism) ([place]) 2014   Started with DVT  . Seasonal allergies   . Thyroid cancer [facility])    Removed thyroid in Aug, 2017, radiation in October     Past Surgical History:  Procedure Laterality Date  . BIOPSY THYROID    . CHOLECYSTECTOMY    . THYROIDECTOMY  2017    Medications  Current Outpatient Medications on File Prior to Visit  Medication Sig Dispense Refill  . aspirin EC 81 MG tablet Take 81 mg by mouth daily.    . cetirizine (ZYRTEC) 10 MG tablet Take 10 mg by mouth daily as needed for allergies.    . cholecalciferol (VITAMIN D) 1000 units tablet Take 1,000 Units by mouth daily.    Marland Kitchen esomeprazole (NEXIUM) 20 MG capsule Take 20 mg by mouth daily at 12 noon.    Marland Kitchen levothyroxine (SYNTHROID, LEVOTHROID) 137 MCG tablet Take 137 mcg by mouth daily before breakfast.    . norethindrone (MICRONOR,CAMILA,ERRIN) 0.35 MG tablet Take 1 tablet by mouth daily.    Marland Kitchen venlafaxine XR (EFFEXOR-XR) 37.5 MG 24 hr capsule TAKE 1 CAPSULE BY MOUTH EACH DAY ALONG WITH 75MG  CAPSULE 90 capsule 3  . venlafaxine XR (EFFEXOR-XR) 75 MG 24 hr capsule Take 1 capsule by mouth daily with the 37.5 mg cap for a total of 112.5 mg. 90 capsule 1   Allergies Allergies  Allergen Reactions  .  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 Penicillins Rash    Childhood allergy Childhood allergy Childhood allergy    Review of Systems: Constitutional:  no unexpected weight changes Eye:  no recent significant change in vision Ear/Nose/Mouth/Throat:  Ears:  no recent change in hearing Nose/Mouth/Throat:  no complaints of nasal congestion, no sore throat Cardiovascular: no chest pain Respiratory:  no shortness of breath Gastrointestinal:  no abdominal pain, no change in bowel habits GU:  Female: negative for dysuria or pelvic pain Musculoskeletal/Extremities:  no pain of the joints Integumentary (Skin/Breast):  no abnormal skin lesions reported Neurologic:  no headaches Endocrine:  denies fatigue Hematologic/Lymphatic:  No areas of easy bleeding  Exam BP 132/76 (BP Location: Left Arm, Patient Position: Sitting, Cuff Size: Normal)   Pulse 87   Temp 98.8 F (37.1 C) (Oral)   Ht 5\' 6"  (1.676 m)   Wt 239 lb 2 oz (108.5 kg)   SpO2 99%   BMI 38.60 kg/m  General:  well developed, well nourished, in no apparent distress Skin:  no significant moles, warts, or growths Head:  no masses, lesions, or tenderness Eyes:  pupils equal and round, sclera anicteric without injection Ears:  canals without lesions, TMs shiny  without retraction, no obvious effusion, no erythema Nose:  nares patent, septum midline, mucosa normal, and no drainage or sinus tenderness Throat/Pharynx:  lips and gingiva without lesion; tongue and uvula midline; non-inflamed pharynx; no exudates or postnasal drainage Neck: neck supple without adenopathy, thyromegaly, or masses Lungs:  clear to auscultation, breath sounds equal bilaterally, no respiratory distress Cardio:  regular rate and rhythm, no LE edema Abdomen:  abdomen soft, nontender; bowel sounds normal; no masses or  organomegaly Genital: Defer to GYN Musculoskeletal:  symmetrical muscle groups noted without atrophy or deformity Extremities:  no clubbing, cyanosis, or edema, no deformities, no skin discoloration Neuro:  gait normal; deep tendon reflexes normal and symmetric Psych: well oriented with normal range of affect and appropriate judgment/insight  Assessment and Plan  Well adult exam - Plan: Lipid panel, Comprehensive metabolic panel  Prediabetes - Plan: Hemoglobin A1c  GAD (generalized anxiety disorder) - Plan: venlafaxine XR (EFFEXOR-XR) 75 MG 24 hr capsule  Need for influenza vaccination - Plan: Flu Vaccine QUAD 6+ mos PF IM (Fluarix Quad PF)  Encounter for hepatitis C screening test for low risk patient - Plan: Hepatitis C antibody   Well 44 y.o. female. Counseled on diet and exercise. Goal wt for next year is 200-210 lbs.  Other orders as above. Decrease Effexor from 112.5 mg/d to 75 mg/d. She is doing well. Follow up in 1 yr or prn. The patient voiced understanding and agreement to the plan.  [place], DO 02/27/20 9:35 AM

## 2020-02-27 NOTE — Patient Instructions (Addendum)
Give [place] 2-3 business days to get the results of your labs back.   Keep the diet clean and stay active.  Aim to do some physical exertion for 150 minutes per week. This is typically divided into 5 days per week, 30 minutes per day. The activity should be enough to get your heart rate up. Anything is better than nothing if you have time constraints.  Goal weight in 1 year: 200-210 lbs.   Let us know if you need anything.  Healthy Eating Plan Many factors influence your heart health, including eating and exercise habits. Heart (coronary) risk increases with abnormal blood fat (lipid) levels. Heart-healthy meal planning includes limiting unhealthy fats, increasing healthy fats, and making other small dietary changes. This includes maintaining a healthy body weight to help keep lipid levels within a normal range.  WHAT IS MY PLAN?  Your health care provider recommends that you:  Drink a glass of water before meals to help with satiety.  Eat slowly.  An alternative to the water is to add Metamucil. This will help with satiety as well. It does contain calories, unlike water.  WHAT TYPES OF FAT SHOULD I CHOOSE?  Choose healthy fats more often. Choose monounsaturated and polyunsaturated fats, such as olive oil and canola oil, flaxseeds, walnuts, almonds, and seeds.  Eat more omega-3 fats. Good choices include salmon, mackerel, sardines, tuna, flaxseed oil, and ground flaxseeds. Aim to eat fish at least two times each week.  Avoid foods with partially hydrogenated oils in them. These contain trans fats. Examples of foods that contain trans fats are stick margarine, some tub margarines, cookies, crackers, and other baked goods. If you are going to avoid a fat, this is the one to avoid!  WHAT GENERAL GUIDELINES DO I NEED TO FOLLOW?  Check food labels carefully to identify foods with trans fats. Avoid these types of options when possible.  Fill one half of your plate with vegetables and green  salads. Eat 4-5 servings of vegetables per day. A serving of vegetables equals 1 cup of raw leafy vegetables,  cup of raw or cooked cut-up vegetables, or  cup of vegetable juice.  Fill one fourth of your plate with whole grains. Look for the word "whole" as the first word in the ingredient list.  Fill one fourth of your plate with lean protein foods.  Eat 4-5 servings of fruit per day. A serving of fruit equals one medium whole fruit,  cup of dried fruit,  cup of fresh, frozen, or canned fruit. Try to avoid fruits in cups/syrups as the sugar content can be high.  Eat more foods that contain soluble fiber. Examples of foods that contain this type of fiber are apples, broccoli, carrots, beans, peas, and barley. Aim to get 20-30 g of fiber per day.  Eat more home-cooked food and less restaurant, buffet, and fast food.  Limit or avoid alcohol.  Limit foods that are high in starch and sugar.  Avoid fried foods when able.  Cook foods by using methods other than frying. Baking, boiling, grilling, and broiling are all great options. Other fat-reducing suggestions include: ? Removing the skin from poultry. ? Removing all visible fats from meats. ? Skimming the fat off of stews, soups, and gravies before serving them. ? Steaming vegetables in water or broth.  Lose weight if you are overweight. Losing just 5-10% of your initial body weight can help your overall health and prevent diseases such as diabetes and heart disease.  Increase your  consumption of nuts, legumes, and seeds to 4-5 servings per week. One serving of dried beans or legumes equals  cup after being cooked, one serving of nuts equals 1 ounces, and one serving of seeds equals  ounce or 1 tablespoon.  WHAT ARE GOOD FOODS CAN I EAT? Grains Grainy breads (try to find bread that is 3 g of fiber per slice or greater), oatmeal, light popcorn. Whole-grain cereals. Rice and pasta, including brown rice and those that are made with  whole wheat. Edamame pasta is a great alternative to grain pasta. It has a higher protein content. Try to avoid significant consumption of white bread, sugary cereals, or pastries/baked goods.  Vegetables All vegetables. Cooked white potatoes do not count as vegetables.  Fruits All fruits, but limit pineapple and bananas as these fruits have a higher sugar content.  Meats and Other Protein Sources Lean, well-trimmed beef, veal, pork, and lamb. Chicken and [place] without skin. All fish and shellfish. Wild duck, rabbit, pheasant, and venison. Egg whites or low-cholesterol egg substitutes. Dried beans, peas, lentils, and tofu.Seeds and most nuts.  Dairy Low-fat or nonfat cheeses, including ricotta, string, and mozzarella. Skim or 1% milk that is liquid, powdered, or evaporated. Buttermilk that is made with low-fat milk. Nonfat or low-fat yogurt. Soy/Almond milk are good alternatives if you cannot handle dairy.  Beverages Water is the best for you. Sports drinks with less sugar are more desirable unless you are a highly active athlete.  Sweets and Desserts Sherbets and fruit ices. Honey, jam, marmalade, jelly, and syrups. Dark chocolate.  Eat all sweets and desserts in moderation.  Fats and Oils Nonhydrogenated (trans-free) margarines. Vegetable oils, including soybean, sesame, sunflower, olive, peanut, safflower, corn, canola, and cottonseed. Salad dressings or mayonnaise that are made with a vegetable oil. Limit added fats and oils that you use for cooking, baking, salads, and as spreads.  Other Cocoa powder. Coffee and tea. Most condiments.  The items listed above may not be a complete list of recommended foods or beverages. Contact your dietitian for more options.

## 2020-02-28 LAB — COMPREHENSIVE METABOLIC PANEL
AG Ratio: 1.4 (calc) (ref 1.0–2.5)
ALT: 17 U/L (ref 6–29)
AST: 15 U/L (ref 10–30)
Albumin: 4 g/dL (ref 3.6–5.1)
Alkaline phosphatase (APISO): 88 U/L (ref 31–125)
BUN: 10 mg/dL (ref 7–25)
CO2: 26 mmol/L (ref 20–32)
Calcium: 9 mg/dL (ref 8.6–10.2)
Chloride: 103 mmol/L (ref 98–110)
Creat: 0.85 mg/dL (ref 0.50–1.10)
Globulin: 2.9 g/dL (calc) (ref 1.9–3.7)
Glucose, Bld: 99 mg/dL (ref 65–99)
Potassium: 4.2 mmol/L (ref 3.5–5.3)
Sodium: 139 mmol/L (ref 135–146)
Total Bilirubin: 0.8 mg/dL (ref 0.2–1.2)
Total Protein: 6.9 g/dL (ref 6.1–8.1)

## 2020-02-28 LAB — LIPID PANEL
Cholesterol: 199 mg/dL (ref <200)
HDL: 60 mg/dL (ref >=50)
LDL Cholesterol (Calc): 123 mg/dL (calc) — ABNORMAL HIGH
Non-HDL Cholesterol (Calc): 139 mg/dL (calc) — ABNORMAL HIGH (ref <130)
Total CHOL/HDL Ratio: 3.3 (calc) (ref <5.0)
Triglycerides: 64 mg/dL (ref <150)

## 2020-02-28 LAB — HEPATITIS C ANTIBODY
Hepatitis C Ab: NONREACTIVE
SIGNAL TO CUT-OFF: 0.01 (ref <1.00)

## 2020-02-28 LAB — HEMOGLOBIN A1C
Hgb A1c MFr Bld: 5.9 % of total Hgb — ABNORMAL HIGH (ref <5.7)
Mean Plasma Glucose: 123 (calc)
eAG (mmol/L): 6.8 (calc)

## 2020-07-20 ENCOUNTER — Telehealth: Payer: Self-pay

## 2020-07-20 ENCOUNTER — Ambulatory Visit: Payer: 59 | Admitting: Internal Medicine

## 2020-07-20 ENCOUNTER — Other Ambulatory Visit: Payer: Self-pay

## 2020-07-20 ENCOUNTER — Emergency Department (HOSPITAL_BASED_OUTPATIENT_CLINIC_OR_DEPARTMENT_OTHER)
Admission: EM | Admit: 2020-07-20 | Discharge: 2020-07-20 | Disposition: A | Discharge status: Home / Self Care | Payer: 59 | Attending: Emergency Medicine | Admitting: Emergency Medicine

## 2020-07-20 ENCOUNTER — Emergency Department (HOSPITAL_BASED_OUTPATIENT_CLINIC_OR_DEPARTMENT_OTHER): Payer: 59

## 2020-07-20 ENCOUNTER — Encounter (HOSPITAL_BASED_OUTPATIENT_CLINIC_OR_DEPARTMENT_OTHER): Payer: Self-pay | Admitting: Emergency Medicine

## 2020-07-20 DIAGNOSIS — Z85850 Personal history of malignant neoplasm of thyroid: Secondary | ICD-10-CM | POA: Diagnosis not present

## 2020-07-20 DIAGNOSIS — R590 Localized enlarged lymph nodes: Secondary | ICD-10-CM | POA: Diagnosis not present

## 2020-07-20 DIAGNOSIS — E876 Hypokalemia: Secondary | ICD-10-CM | POA: Diagnosis not present

## 2020-07-20 DIAGNOSIS — Z86711 Personal history of pulmonary embolism: Secondary | ICD-10-CM | POA: Insufficient documentation

## 2020-07-20 DIAGNOSIS — R220 Localized swelling, mass and lump, head: Secondary | ICD-10-CM | POA: Diagnosis present

## 2020-07-20 DIAGNOSIS — H73891 Other specified disorders of tympanic membrane, right ear: Secondary | ICD-10-CM | POA: Insufficient documentation

## 2020-07-20 LAB — COMPREHENSIVE METABOLIC PANEL
ALT: 22 U/L (ref 0–44)
AST: 17 U/L (ref 15–41)
Albumin: 4.1 g/dL (ref 3.5–5.0)
Alkaline Phosphatase: 90 U/L (ref 38–126)
Anion gap: 10 (ref 5–15)
BUN: 9 mg/dL (ref 6–20)
CO2: 25 mmol/L (ref 22–32)
Calcium: 9.1 mg/dL (ref 8.9–10.3)
Chloride: 103 mmol/L (ref 98–111)
Creatinine, Ser: 0.82 mg/dL (ref 0.44–1.00)
GFR, Estimated: >60 mL/min (ref >60)
Glucose, Bld: 104 mg/dL — ABNORMAL HIGH (ref 70–99)
Potassium: 3.3 mmol/L — ABNORMAL LOW (ref 3.5–5.1)
Sodium: 138 mmol/L (ref 135–145)
Total Bilirubin: 0.6 mg/dL (ref 0.3–1.2)
Total Protein: 7.6 g/dL (ref 6.5–8.1)

## 2020-07-20 LAB — CBC WITH DIFFERENTIAL/PLATELET
Abs Immature Granulocytes: 0.03 10*3/uL (ref 0.00–0.07)
Basophils Absolute: 0.1 10*3/uL (ref 0.0–0.1)
Basophils Relative: 1 %
Eosinophils Absolute: 0.2 10*3/uL (ref 0.0–0.5)
Eosinophils Relative: 2 %
HCT: 35.3 % — ABNORMAL LOW (ref 36.0–46.0)
Hemoglobin: 11.6 g/dL — ABNORMAL LOW (ref 12.0–15.0)
Immature Granulocytes: 0 %
Lymphocytes Relative: 29 %
Lymphs Abs: 3 10*3/uL (ref 0.7–4.0)
MCH: 25.5 pg — ABNORMAL LOW (ref 26.0–34.0)
MCHC: 32.9 g/dL (ref 30.0–36.0)
MCV: 77.6 fL — ABNORMAL LOW (ref 80.0–100.0)
Monocytes Absolute: 1 10*3/uL (ref 0.1–1.0)
Monocytes Relative: 10 %
Neutro Abs: 5.8 10*3/uL (ref 1.7–7.7)
Neutrophils Relative %: 58 %
Platelets: 365 10*3/uL (ref 150–400)
RBC: 4.55 MIL/uL (ref 3.87–5.11)
RDW: 15 % (ref 11.5–15.5)
WBC: 10.1 10*3/uL (ref 4.0–10.5)
nRBC: 0 % (ref 0.0–0.2)

## 2020-07-20 LAB — HCG, SERUM, QUALITATIVE: Preg, Serum: NEGATIVE

## 2020-07-20 MED ORDER — IOHEXOL 300 MG/ML  SOLN
100.0000 mL | Freq: Once | INTRAMUSCULAR | Status: AC | PRN
  Administered 2020-07-20: 75 mL via INTRAVENOUS

## 2020-07-20 NOTE — ED Triage Notes (Signed)
Possible abscess or mass behind right ear. Was seen and told to come to Ed for CT. Tender to touch.

## 2020-07-20 NOTE — Telephone Encounter (Signed)
Nurse Assessment Nurse: Raphael Gibney, RN, Vanita Ingles Date/Time (Eastern Time): 07/20/2020 10:18:18 AM Confirm and document reason for call. If symptomatic, describe symptoms. ---Caller states she went to urgent care this am. has a knot behind her ear. urgent care thought she had mastoiditis and she ordered a xray. Radiologist said she needed a CT scan and urgent care said to go to the ER. she has a prescription for levaquin. office said she could not see PCP today as she went to urgent care. Does the patient have any new or worsening symptoms? ---Yes Will a triage be completed? ---No Select reason for no triage. ---Other Please document clinical information provided and list any resource used. ---advised caller that she needs to go to the ER as recommended by the urgent care. States she will. Disp. Time Eilene Ghazi Time) Disposition Final User 07/20/2020 10:26:49 AM Clinical Call Yes Raphael Gibney, RN, Vera   Pt in ED.

## 2020-07-20 NOTE — Discharge Instructions (Addendum)
You were provided with a copy of your CT results.  You may terminate ibuprofen or Tylenol to help with the pain.

## 2020-07-20 NOTE — ED Provider Notes (Signed)
[facility] EMERGENCY DEPARTMENT Provider Note   CSN: [id number] Arrival date & time: 07/20/20  1058     History Chief Complaint  Patient presents with  . Abscess    Right ear    Amy Mccall is a 45 y.o. female.  45 y.o female with a PMH of PE, thyroid cancer presents to the ED with a chief complaint of right sided knot behind her ear x1 day.  Patient reports going to urgent care and [place] today, when she was told she likely had mastoiditis, she was given a prescription for Levaquin and attempted to have the x-ray however was told to be seen in the ED for further imaging.  She states calling her PCP, who also suggested patient will to be seen in the ED.  Does endorse some pain behind her right ear, states she noted this 1 day ago, in addition, she also suffers from sinus issues, has had some right sided muffled hearing, dizziness at the beginning which only occur with standing but otherwise states "I feel fine".  She does report having a subjective fever urgent care, however was told that it was "above 100.4 ", she was given ibuprofen then and arrived afebrile here.  She has not had any prior ear infection, no prior history of MRSA, no shortness of breath or chest pain.   The history is provided by the patient.  Abscess Location:  Head/neck Head/neck abscess location:  R ear Size:  2 Abscess quality: induration and painful   Abscess quality: no fluctuance and no redness   Duration:  1 day Progression:  Unchanged Pain details:    Quality:  Aching   Severity:  Mild   Duration:  1 day   Timing:  Constant   Progression:  Unchanged Chronicity:  New Context: not diabetes, not immunosuppression, not injected drug use, not insect bite/sting and not skin injury   Relieved by:  Nothing Worsened by:  Nothing Ineffective treatments:  None tried Associated symptoms: no fatigue, no fever, no headaches, no nausea and no vomiting   Risk factors: no hx of MRSA and no prior  abscess        Past Medical History:  Diagnosis Date  . Generalized anxiety disorder    Follows with a Social worker  . GERD (gastroesophageal reflux disease)   . PE (pulmonary thromboembolism) ([place]) 2014   Started with DVT  . Seasonal allergies   . Thyroid cancer [facility])    Removed thyroid in Aug, 2017, radiation in October    Patient Active Problem List   Diagnosis Date Noted  . Prediabetes 02/27/2020  . Obesity (BMI 30-39.9) 02/04/2018  . GAD (generalized anxiety disorder) 09/07/2017    Past Surgical History:  Procedure Laterality Date  . BIOPSY THYROID    . CHOLECYSTECTOMY    . THYROIDECTOMY  2017     OB History   No obstetric history on file.     Family History  Problem Relation Age of Onset  . Osteoporosis Mother   . COPD Father   . Diabetes Father   . Heart disease Father   . Colon cancer Neg Hx     Social History   Tobacco Use  . Smoking status: Never Smoker  . Smokeless tobacco: Never Used  Substance Use Topics  . Alcohol use: No  . Drug use: No    Home Medications Prior to Admission medications   Medication Sig Start Date End Date Taking? Authorizing Provider  aspirin EC 81 MG tablet  Take 81 mg by mouth daily.    [provider]  cetirizine (ZYRTEC) 10 MG tablet Take 10 mg by mouth daily as needed for allergies.    [provider]  cholecalciferol (VITAMIN D) 1000 units tablet Take 1,000 Units by mouth daily.    [provider]  esomeprazole (NEXIUM) 20 MG capsule Take 20 mg by mouth daily at 12 noon.    [provider]  levothyroxine (SYNTHROID, LEVOTHROID) 137 MCG tablet Take 137 mcg by mouth daily before breakfast.    [provider]  norethindrone (MICRONOR,CAMILA,ERRIN) 0.35 MG tablet Take 1 tablet by mouth daily.    [provider]  venlafaxine XR (EFFEXOR-XR) 75 MG 24 hr capsule Take 1 capsule (75 mg total) by mouth daily with breakfast. 02/27/20   Shirleymae Hauth, Crosby Oyster, DO     Allergies    Sulfa antibiotics, Sulfacetamide sodium, and Penicillins  Review of Systems   Review of Systems  Constitutional: Negative for fatigue and fever.  HENT: Negative for congestion, dental problem, ear discharge, ear pain, facial swelling, hearing loss and sore throat.   Cardiovascular: Negative for chest pain.  Gastrointestinal: Negative for nausea and vomiting.  Genitourinary: Negative for flank pain.  Neurological: Negative for headaches.  All other systems reviewed and are negative.   Physical Exam Updated Vital Signs BP (!) 144/104 (BP Location: Left Leg)   Pulse 88   Temp 98.4 F (36.9 C) (Oral)   Resp 16   Ht 5\' 6"  (1.676 m)   Wt 106.6 kg   SpO2 98%   BMI 37.93 kg/m   Physical Exam Vitals and nursing note reviewed.  Constitutional:      General: She is not in acute distress.    Appearance: She is well-developed.  HENT:     Head: Normocephalic and atraumatic.      Right Ear: Tympanic membrane normal. Tenderness present. No swelling. There is no impacted cerumen. There is mastoid tenderness. Tympanic membrane is not injected.     Left Ear: Tympanic membrane normal. No swelling or tenderness. There is no impacted cerumen. No mastoid tenderness. Tympanic membrane is not injected.     Mouth/Throat:     Pharynx: No oropharyngeal exudate.  Eyes:     Pupils: Pupils are equal, round, and reactive to light.  Cardiovascular:     Rate and Rhythm: Regular rhythm.     Heart sounds: Normal heart sounds.  Pulmonary:     Effort: Pulmonary effort is normal. No respiratory distress.     Breath sounds: Normal breath sounds.  Abdominal:     General: Bowel sounds are normal. There is no distension.     Palpations: Abdomen is soft.     Tenderness: There is no abdominal tenderness.  Musculoskeletal:        General: No tenderness or deformity.     Cervical back: Normal range of motion.     Right lower leg: No edema.     Left lower leg: No edema.  Skin:    General:  Skin is warm and dry.  Neurological:     Mental Status: She is alert and oriented to person, place, and time.     ED Results / Procedures / Treatments   Labs (all labs ordered are listed, but only abnormal results are displayed) Labs Reviewed  CBC WITH DIFFERENTIAL/PLATELET - Abnormal; Notable for the following components:      Result Value   Hemoglobin 11.6 (*)    HCT 35.3 (*)  MCV 77.6 (*)    MCH 25.5 (*)    All other components within normal limits  COMPREHENSIVE METABOLIC PANEL - Abnormal; Notable for the following components:   Potassium 3.3 (*)    Glucose, Bld 104 (*)    All other components within normal limits  HCG, SERUM, QUALITATIVE    EKG None  Radiology CT Soft Tissue Neck W Contrast  Result Date: 07/20/2020 CLINICAL DATA:  Neck mass behind right ear concern for mastoiditis EXAM: CT NECK WITH CONTRAST TECHNIQUE: Multidetector CT imaging of the neck was performed using the standard protocol following the bolus administration of intravenous contrast. CONTRAST:  58mL OMNIPAQUE IOHEXOL 300 MG/ML  SOLN COMPARISON:  None. FINDINGS: Pharynx and larynx: Unremarkable.  No mass or swelling. Salivary glands: Parotid and submandibular glands are. Thyroid: Absent. Lymph nodes: In the right posterior auricular region, there is a 1.1 cm enhancing lesion probably reflecting a lymph node. Bilateral nonenlarged cervical and supraclavicular nodes. Vascular: Major neck vessels are patent. Limited intracranial: No abnormal enhancement. Visualized orbits: Unremarkable. Mastoids and visualized paranasal sinuses: Aerated. Skeleton: Degenerative changes of the temporomandibular joints. Upper chest: Included upper lungs are clear. Other: None. IMPRESSION: 1.1 cm lesion in the right posterior auricular region probably reflects palpable abnormality. Favored to reflect a mildly enlarged lymph node. Suggest clinical follow-up given this is palpable. There is no evidence otomastoiditis. Electronically  Signed   By: Macy Mis M.D.   On: 07/20/2020 12:36    Procedures Procedures   Medications Ordered in ED Medications  iohexol (OMNIPAQUE) 300 MG/ML solution 100 mL (75 mLs Intravenous Contrast Given 07/20/20 1212)    ED Course  I have reviewed the triage vital signs and the nursing notes.  Pertinent labs & imaging results that were available during my care of the patient were reviewed by me and considered in my medical decision making (see chart for details).  Clinical Course as of 07/20/20 1246  Fri Jul 20, 2020  1240 Creatinine: 0.82 [JS]    Clinical Course User Index [JS] Janeece Fitting, PA-C   MDM Rules/Calculators/A&P  Patient sent in by urgent care and PCP for suspicion of mastoiditis along with CT imaging.  Given a prescription of Levaquin by urgent care and sent in for further evaluation.  She reports no prior ear infections, no fever, no chest pain, mild dizziness noted along with some muffled hearing.  She does have a prior history of thyroid cancer.  Arrived in the ED hypertensive, afebrile but reports she received ibuprofen while at urgent care.  During my evaluation there is palpation along the mastoid, there is a firm, indurated likely cyst behind the right ear, however there is pain reflective with palpation of the mastoid bone.  Mild erythema and tenderness noted to the right TM without effusion.  Left ear appears intact.  Oropharynx is clear without any tonsillar exudate or PTA.  We discussed obtaining screening blood work, CT in order to further evaluate her symptoms.  Interpretation of  Her labs CBC without any leukocytosis, hgb is slightly decreased bu within her baseline. CMP with a decrease in her potassium, creatinine levels within normal limits.  LFTs are unremarkable.  hCG is negative.  CT soft tissue neck showed: 1.1 cm lesion in the right posterior auricular region probably  reflects palpable abnormality. Favored to reflect a mildly enlarged  lymph node.  Suggest clinical follow-up given this is palpable.   These results were discussed at length with patient, she is aware that this will likely subside  on its own, she may take Tylenol or ibuprofen to help with pain. Patient understands and agrees with management return precautions discussed at length   Portions of this note were generated with Dragon dictation software. Dictation errors may occur despite best attempts at proofreading.  Final Clinical Impression(s) / ED Diagnoses Final diagnoses:  Posterior auricular lymphadenopathy    Rx / DC Orders ED Discharge Orders    None       Janeece Fitting, PA-C 07/20/20 1246    Isla Pence, MD 07/20/20 1329

## 2020-07-27 ENCOUNTER — Telehealth (INDEPENDENT_AMBULATORY_CARE_PROVIDER_SITE_OTHER): Payer: 59 | Admitting: Family Medicine

## 2020-07-27 ENCOUNTER — Telehealth: Payer: Self-pay

## 2020-07-27 ENCOUNTER — Other Ambulatory Visit: Payer: Self-pay

## 2020-07-27 ENCOUNTER — Encounter: Payer: Self-pay | Admitting: Family Medicine

## 2020-07-27 VITALS — Temp 100.4°F

## 2020-07-27 DIAGNOSIS — R6889 Other general symptoms and signs: Secondary | ICD-10-CM | POA: Diagnosis not present

## 2020-07-27 MED ORDER — PREDNISONE 20 MG PO TABS: 40.0000 mg | ORAL_TABLET | Freq: Every day | ORAL | 0 refills | Status: AC

## 2020-07-27 NOTE — Telephone Encounter (Signed)
Patient has appointment today with PCP  °

## 2020-07-27 NOTE — Progress Notes (Signed)
Chief Complaint  Patient presents with  . Fever  . Generalized Body Aches    Amy Mccall here for URI complaints. Due to COVID-19 pandemic, we are interacting via web portal for an electronic face-to-face visit. I verified patient's ID using 2 identifiers. Patient agreed to proceed with visit via this method. Patient is at home, I am at office. Patient and I are present for visit.   Duration: 3 weeks; had improved after taking Levaquin and 3 d ago started having new s/s's Associated symptoms: Fever (Tmax 102 F), myalgia and swollen LN's Denies: sinus congestion, sinus pain, rhinorrhea, itchy watery eyes, ear pain, ear drainage, sore throat, wheezing, shortness of breath and coughing, N/V/D Treatment to date: Levaquin, Tylenol, ibuprofen Sick contacts: No  Tested neg for flu, strep, covid. Labs largely unremarkable. CT neg for mastoiditis.   Past Medical History:  Diagnosis Date  . Generalized anxiety disorder    Follows with a Social worker  . GERD (gastroesophageal reflux disease)   . PE (pulmonary thromboembolism) ([place]) 2014   Started with DVT  . Seasonal allergies   . Thyroid cancer ([place])    Removed thyroid in Aug, 2017, radiation in October   Objective Temp (!) 100.4 F (38 C) (Oral)  No conversational dyspnea Age appropriate judgment and insight Nml affect and mood  Flu-like symptoms - Plan: predniSONE (DELTASONE) 20 MG tablet  Sounds like mono, 5 d pred burst 40 mg/d.  Continue to push fluids, practice good hand hygiene, cover mouth when coughing. F/u prn. If starting to experience fevers, shaking, or shortness of breath, seek immediate care. Pt voiced understanding and agreement to the plan.  [place], DO 07/27/20 2:30 PM

## 2020-07-27 NOTE — Telephone Encounter (Signed)
Nurse Assessment Nurse: Windle Guard, RN, Olin Hauser Date/Time (Eastern Time): 07/26/2020 1:42:59 PM Confirm and document reason for call. If symptomatic, describe symptoms. ---Caller states she had a knot come up behind her (R) ear about a week ago. Knot has gone down some, but still present . Now has fever of 101.4. No appointments available in office. Does the patient have any new or worsening symptoms? ---Yes Will a triage be completed? ---Yes Related visit to physician within the last 2 weeks? ---No Does the PT have any chronic conditions? (i.e. diabetes, asthma, this includes High risk factors for pregnancy, etc.) ---No Is the patient pregnant or possibly pregnant? (Ask all females between the ages of 15-55) ---No Is this a behavioral health or substance abuse call? ---No Guidelines Guideline Title Affirmed Question Affirmed Notes Nurse Date/Time Eilene Ghazi Time) Lymph Nodes - Swollen [1] Overlying skin is red AND [2] fever Windle Guard, RN, Olin Hauser 07/26/2020 1:46:09 PM Disp. Time Eilene Ghazi Time) Disposition Final User PLEASE NOTE: All timestamps contained within this report are represented as [place] Standard Time. CONFIDENTIALTY NOTICE: This fax transmission is intended only for the addressee. It contains information that is legally privileged, confidential or otherwise protected from use or disclosure. If you are not the intended recipient, you are strictly prohibited from reviewing, disclosing, copying using or disseminating any of this information or taking any action in reliance on or regarding this information. If you have received this fax in error, please notify us immediately by telephone so that we can arrange for its return to [place]. Phone: [phone], Toll-Free: [phone], Fax: [phone] Page: 2 of 2 Call Id: [id number] 07/26/2020 1:49:17 PM See HCP within 4 Hours (or PCP triage) Yes Windle Guard, RN, Otho Najjar Disagree/Comply Comply Caller Understands Yes PreDisposition Go to Urgent  Care/Walk-In Clinic Care Advice Given Per Guideline SEE HCP (OR PCP TRIAGE) WITHIN 4 HOURS: CARE ADVICE given per Lymph Nodes Swollen (Adult) guideline. Comments User: Harvin Hazel, RN Date/Time Eilene Ghazi Time): 07/26/2020 1:47:05 PM Was seen Fri @ [facility] , started on Levaquin Referrals [facility] - SPECIFY

## 2020-07-30 ENCOUNTER — Encounter: Payer: Self-pay | Admitting: Family Medicine

## 2020-08-20 ENCOUNTER — Encounter: Payer: Self-pay | Admitting: Family Medicine

## 2020-08-20 ENCOUNTER — Other Ambulatory Visit: Payer: Self-pay

## 2020-08-20 ENCOUNTER — Ambulatory Visit: Payer: 59 | Admitting: Family Medicine

## 2020-08-20 VITALS — BP 152/100 | HR 106 | Temp 99.4°F | Ht 67.0 in | Wt 238.0 lb

## 2020-08-20 DIAGNOSIS — J301 Allergic rhinitis due to pollen: Secondary | ICD-10-CM | POA: Diagnosis not present

## 2020-08-20 DIAGNOSIS — R030 Elevated blood-pressure reading, without diagnosis of hypertension: Secondary | ICD-10-CM | POA: Diagnosis not present

## 2020-08-20 DIAGNOSIS — H6592 Unspecified nonsuppurative otitis media, left ear: Secondary | ICD-10-CM | POA: Diagnosis not present

## 2020-08-20 LAB — POCT RAPID STREP A (OFFICE): Rapid Strep A Screen: NEGATIVE

## 2020-08-20 MED ORDER — FLUTICASONE PROPIONATE 50 MCG/ACT NA SUSP: 2.0000 | Freq: Every day | NASAL | 2 refills | Status: AC

## 2020-08-20 MED ORDER — PREDNISONE 20 MG PO TABS: 40.0000 mg | ORAL_TABLET | Freq: Every day | ORAL | 0 refills | Status: AC

## 2020-08-20 NOTE — Progress Notes (Signed)
Chief Complaint  Patient presents with  . Sore Throat    Amy Mccall here for URI complaints.  Duration: 9 days  Associated symptoms: ear pain, sore throat and cough (from drainage) Denies: sinus congestion, sinus pain, rhinorrhea, itchy watery eyes, ear drainage, wheezing, shortness of breath, myalgia and fevers Treatment to date: Mucinex DM, Advil, Sudafed Sick contacts: No  Past Medical History:  Diagnosis Date  . Generalized anxiety disorder    Follows with a Social worker  . GERD (gastroesophageal reflux disease)   . PE (pulmonary thromboembolism) ([place]) 2014   Started with DVT  . Seasonal allergies   . Thyroid cancer ([place])    Removed thyroid in Aug, 2017, radiation in October    BP (!) 152/100 (BP Location: Left Arm, Patient Position: Sitting, Cuff Size: Normal)   Pulse (!) 106   Temp 99.4 F (37.4 C) (Oral)   Ht 5\' 7"  (1.702 m)   Wt 238 lb (108 kg)   SpO2 97%   BMI 37.28 kg/m  General: Awake, alert, appears stated age HEENT: AT, [place], ears patent b/l and TM on R slightly retracted, mildly bulging w serous fluid on L; nares patent w/o discharge, pharynx pink and without exudates, MMM Neck: No masses or asymmetry Heart: Reg rhythm, tachycardic Lungs: CTAB, no accessory muscle use Psych: Age appropriate judgment and insight, normal mood and affect  Fluid level behind tympanic membrane of left ear - Plan: predniSONE (DELTASONE) 20 MG tablet  Seasonal allergic rhinitis due to pollen - Plan: fluticasone (FLONASE) 50 MCG/ACT nasal spray  Elevated blood pressure reading  1/2. 5 d pred burst, 40 mg/d for ear. Add INCS when allergies flare in future. F/u prn. If starting to experience fevers, shaking, or shortness of breath, seek immediate care. 3. Likely related to OTC med use.  Pt voiced understanding and agreement to the plan.  [place], DO 08/20/20 3:44 PM

## 2020-08-20 NOTE — Patient Instructions (Addendum)
Continue the Zyrtec. Use the nasal spray if you are going to be outside or if your allergies are acting up.  Monitor your blood pressure at home.  Let us know if you need anything.

## 2020-10-24 ENCOUNTER — Encounter: Payer: Self-pay | Admitting: Family Medicine

## 2020-10-25 ENCOUNTER — Encounter: Payer: Self-pay | Admitting: *Deleted

## 2020-12-31 ENCOUNTER — Other Ambulatory Visit: Payer: Self-pay | Admitting: Family Medicine

## 2020-12-31 DIAGNOSIS — F411 Generalized anxiety disorder: Secondary | ICD-10-CM

## 2021-03-04 ENCOUNTER — Encounter: Payer: Self-pay | Admitting: Family Medicine

## 2021-03-04 ENCOUNTER — Other Ambulatory Visit: Payer: Self-pay

## 2021-03-04 ENCOUNTER — Ambulatory Visit (INDEPENDENT_AMBULATORY_CARE_PROVIDER_SITE_OTHER): Payer: 59 | Admitting: Family Medicine

## 2021-03-04 VITALS — BP 138/86 | HR 84 | Temp 99.1°F | Ht 66.0 in | Wt 241.2 lb

## 2021-03-04 DIAGNOSIS — E890 Postprocedural hypothyroidism: Secondary | ICD-10-CM | POA: Diagnosis not present

## 2021-03-04 DIAGNOSIS — Z0000 Encounter for general adult medical examination without abnormal findings: Secondary | ICD-10-CM

## 2021-03-04 DIAGNOSIS — Z23 Encounter for immunization: Secondary | ICD-10-CM

## 2021-03-04 DIAGNOSIS — F411 Generalized anxiety disorder: Secondary | ICD-10-CM

## 2021-03-04 DIAGNOSIS — R7303 Prediabetes: Secondary | ICD-10-CM | POA: Diagnosis not present

## 2021-03-04 DIAGNOSIS — Z85850 Personal history of malignant neoplasm of thyroid: Secondary | ICD-10-CM

## 2021-03-04 LAB — COMPREHENSIVE METABOLIC PANEL
ALT: 18 U/L (ref 0–35)
AST: 14 U/L (ref 0–37)
Albumin: 4.2 g/dL (ref 3.5–5.2)
Alkaline Phosphatase: 91 U/L (ref 39–117)
BUN: 10 mg/dL (ref 6–23)
CO2: 30 mEq/L (ref 19–32)
Calcium: 9.6 mg/dL (ref 8.4–10.5)
Chloride: 102 mEq/L (ref 96–112)
Creatinine, Ser: 0.83 mg/dL (ref 0.40–1.20)
GFR: 85.45 mL/min (ref >60.00)
Glucose, Bld: 93 mg/dL (ref 70–99)
Potassium: 4 mEq/L (ref 3.5–5.1)
Sodium: 140 mEq/L (ref 135–145)
Total Bilirubin: 0.7 mg/dL (ref 0.2–1.2)
Total Protein: 7.1 g/dL (ref 6.0–8.3)

## 2021-03-04 LAB — CBC
HCT: 36.6 % (ref 36.0–46.0)
Hemoglobin: 11.6 g/dL — ABNORMAL LOW (ref 12.0–15.0)
MCHC: 31.7 g/dL (ref 30.0–36.0)
MCV: 78.8 fl (ref 78.0–100.0)
Platelets: 363 10*3/uL (ref 150.0–400.0)
RBC: 4.64 Mil/uL (ref 3.87–5.11)
RDW: 15.9 % — ABNORMAL HIGH (ref 11.5–15.5)
WBC: 10.9 10*3/uL — ABNORMAL HIGH (ref 4.0–10.5)

## 2021-03-04 LAB — TSH: TSH: 0.11 u[IU]/mL — ABNORMAL LOW (ref 0.35–5.50)

## 2021-03-04 LAB — LIPID PANEL
Cholesterol: 205 mg/dL — ABNORMAL HIGH (ref 0–200)
HDL: 58 mg/dL (ref >39.00)
LDL Cholesterol: 121 mg/dL — ABNORMAL HIGH (ref 0–99)
NonHDL: 147.16
Total CHOL/HDL Ratio: 4
Triglycerides: 131 mg/dL (ref 0.0–149.0)
VLDL: 26.2 mg/dL (ref 0.0–40.0)

## 2021-03-04 LAB — T4, FREE: Free T4: 0.96 ng/dL (ref 0.60–1.60)

## 2021-03-04 LAB — HEMOGLOBIN A1C: Hgb A1c MFr Bld: 5.9 % (ref 4.6–6.5)

## 2021-03-04 MED ORDER — LEVOTHYROXINE SODIUM 137 MCG PO TABS: 137.0000 ug | ORAL_TABLET | Freq: Every day | ORAL | 3 refills | Status: DC

## 2021-03-04 MED ORDER — VENLAFAXINE HCL ER 75 MG PO CP24: ORAL_CAPSULE | ORAL | 3 refills | Status: DC

## 2021-03-04 NOTE — Progress Notes (Signed)
Chief Complaint  Patient presents with   Annual Exam     Well Woman Amy Mccall is here for a complete physical.   Her last physical was >1 year ago.  Current diet: in general, a "healthy" diet. Current exercise: none. Weight is stable and she denies fatigue out of ordinary. Seatbelt? Yes  Health Maintenance Pap/HPV- Yes Mammogram- Yes Tetanus- Yes Hep C screening- Yes HIV screening- Yes  GAD Hx of GAD on Effexor XR 75 mg/d. She is currently following with a counselor/psychologist. No self medication. No SI or HI.   Hypothyroidism Patient presents for follow-up of hypothyroidism. Hx of thyroid cancer, radioactive iodine tx and subsequent removal.  Reports compliance with medication- Synthroid 137 mcg/d. Current symptoms include: denies fatigue, weight changes, heat/cold intolerance, bowel/skin changes or CVS symptoms She believes her dose should be unchanged  Past Medical History:  Diagnosis Date   Generalized anxiety disorder    Follows with a counselor   GERD (gastroesophageal reflux disease)    PE (pulmonary thromboembolism) ([place]) 2014   Started with DVT   Seasonal allergies    Thyroid cancer ([place])    Removed thyroid in Aug, 2017, radiation in October     Past Surgical History:  Procedure Laterality Date   BIOPSY THYROID     CHOLECYSTECTOMY     THYROIDECTOMY  2017    Medications  Current Outpatient Medications on File Prior to Visit  Medication Sig Dispense Refill   aspirin EC 81 MG tablet Take 81 mg by mouth daily.     cetirizine (ZYRTEC) 10 MG tablet Take 10 mg by mouth daily as needed for allergies.     cholecalciferol (VITAMIN D) 1000 units tablet Take 1,000 Units by mouth daily.     esomeprazole (NEXIUM) 20 MG capsule Take 20 mg by mouth daily at 12 noon.     fluticasone (FLONASE) 50 MCG/ACT nasal spray Place 2 sprays into both nostrils daily. 16 g 2   levothyroxine (SYNTHROID, LEVOTHROID) 137 MCG tablet Take 137 mcg by mouth daily before breakfast.      norethindrone (MICRONOR,CAMILA,ERRIN) 0.35 MG tablet Take 1 tablet by mouth daily.     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   Penicillins Rash    Childhood allergy Childhood allergy Childhood allergy    Review of Systems: Constitutional:  no unexpected weight changes Eye:  no recent significant change in vision Ear/Nose/Mouth/Throat:  Ears:  no recent change in hearing Nose/Mouth/Throat:  no complaints of nasal congestion, no sore throat Cardiovascular: no chest pain Respiratory:  no shortness of breath Gastrointestinal:  no abdominal pain, no change in bowel habits GU:  Female: negative for dysuria or pelvic pain Musculoskeletal/Extremities:  no pain of the joints Integumentary (Skin/Breast):  no abnormal skin lesions reported Neurologic:  no headaches Endocrine:  denies fatigue Hematologic/Lymphatic:  No areas of easy bleeding  Exam BP 138/86   Pulse 84   Temp 99.1 F (37.3 C) (Oral)   Ht 5\' 6"  (1.676 m)   Wt 241 lb 4 oz (109.4 kg)   SpO2 96%   BMI 38.94 kg/m  General:  well developed, well nourished, in no apparent distress Skin:  no significant moles, warts, or growths Head:  no masses, lesions, or tenderness  Eyes:  pupils equal and round, sclera anicteric without injection Ears:  canals without lesions, TMs shiny without retraction, no obvious effusion, no erythema Nose:  nares patent, septum midline, mucosa normal, and no drainage or sinus tenderness Throat/Pharynx:  lips and gingiva without lesion; tongue and uvula midline; non-inflamed pharynx; no exudates or postnasal drainage Neck: neck supple without adenopathy, thyromegaly, or masses Lungs:  clear to auscultation, breath  sounds equal bilaterally, no respiratory distress Cardio:  regular rate and rhythm, no LE edema Abdomen:  abdomen soft, nontender; bowel sounds normal; no masses or organomegaly Genital: Defer to GYN Musculoskeletal:  symmetrical muscle groups noted without atrophy or deformity Extremities:  no clubbing, cyanosis, or edema, no deformities, no skin discoloration Neuro:  gait normal; deep tendon reflexes normal and symmetric Psych: well oriented with normal range of affect and appropriate judgment/insight  Assessment and Plan  Well adult exam - Plan: Lipid panel, Comprehensive metabolic panel, CBC  Need for influenza vaccination - Plan: Flu Vaccine QUAD 6+ mos PF IM (Fluarix Quad PF)  GAD (generalized anxiety disorder) - Plan: venlafaxine XR (EFFEXOR-XR) 75 MG 24 hr capsule  History of thyroid cancer - Plan: Thyroglobulin antibody  Prediabetes - Plan: Hemoglobin A1c  Post-operative hypothyroidism - Plan: TSH, T4, free, levothyroxine (SYNTHROID) 137 MCG tablet   Well 45 y.o. female. Counseled on diet and exercise. Other orders as above. GAD- chronic, stable. Cont Effexor XR 75 mg/d. Cont w counselor. Hypothyroid: Chronic,s table. Cont Synthroid 137 mcg/d. Hx of cancer. Will ck thyroglobulin levels yearly given this. Follow up in 1 yr or prn. The patient voiced understanding and agreement to the plan.  [place], DO 03/04/21 9:23 AM

## 2021-03-04 NOTE — Patient Instructions (Addendum)
Give [place] 2-3 business days to get the results of your labs back.   Keep the diet clean and stay active.  I recommend getting the updated bivalent covid vaccination booster at your convenience.   Let us know if you need anything.

## 2021-03-05 LAB — THYROGLOBULIN ANTIBODY: Thyroglobulin Ab: 1 IU/mL (ref <=1)

## 2021-03-30 ENCOUNTER — Other Ambulatory Visit: Payer: Self-pay | Admitting: Family Medicine

## 2021-03-30 DIAGNOSIS — F411 Generalized anxiety disorder: Secondary | ICD-10-CM

## 2021-09-16 ENCOUNTER — Other Ambulatory Visit: Payer: Self-pay | Admitting: Obstetrics and Gynecology

## 2021-09-16 DIAGNOSIS — Z1231 Encounter for screening mammogram for malignant neoplasm of breast: Secondary | ICD-10-CM

## 2021-09-28 IMAGING — CT CT NECK W/ CM
3 of 4 series · 14 of 33 positions shown, 17 images · IV contrast (Omnipaque)
Comparison: None.

CLINICAL DATA: Neck mass behind right ear concern for mastoiditis

EXAM:
CT NECK WITH CONTRAST
TECHNIQUE: Multidetector CT imaging of the neck was performed using the
standard protocol following the bolus administration of intravenous
contrast.
CONTRAST:  75mL OMNIPAQUE IOHEXOL 300 MG/ML  SOLN

[Series 6: sag neck · sagittal · 0.46mm/px · 5 of 105 slices shown, 6 images]
[im 35/105  bone]
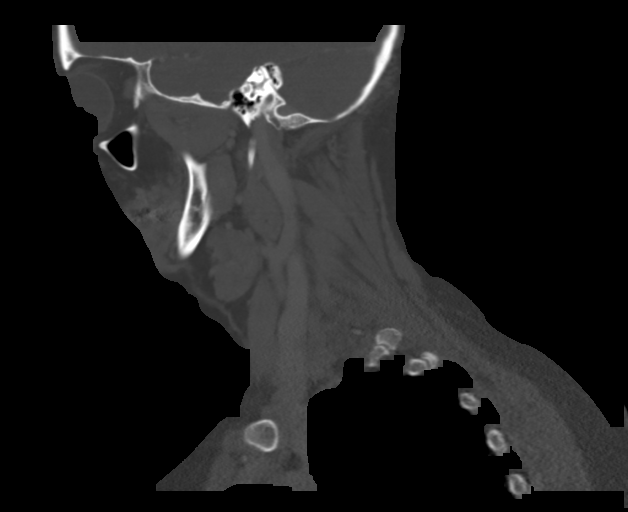
[im 44/105  bone]
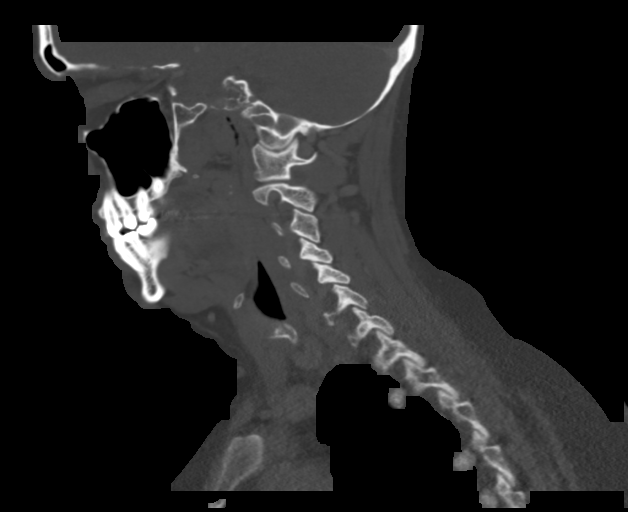
[im 53/105  soft-tissue]
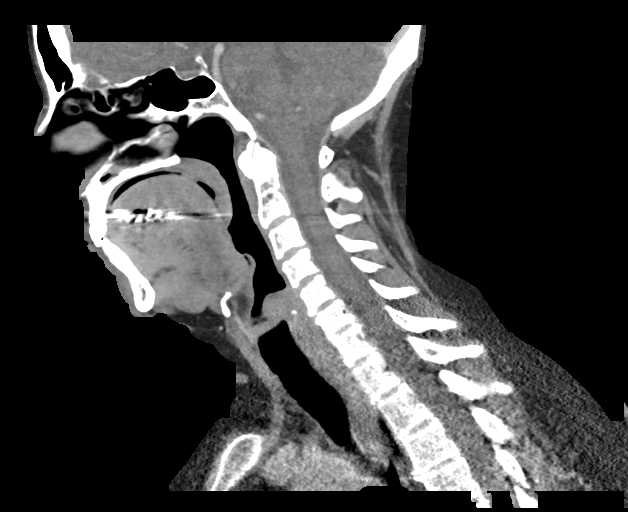
[im 53/105  bone]
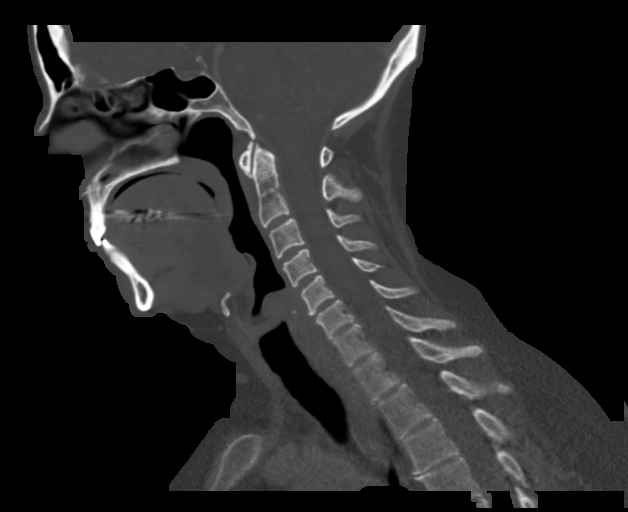
[im 61/105  bone]
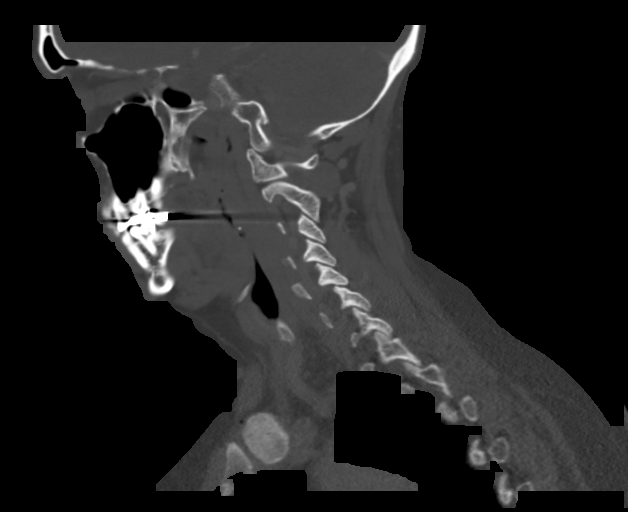
[im 70/105  bone]
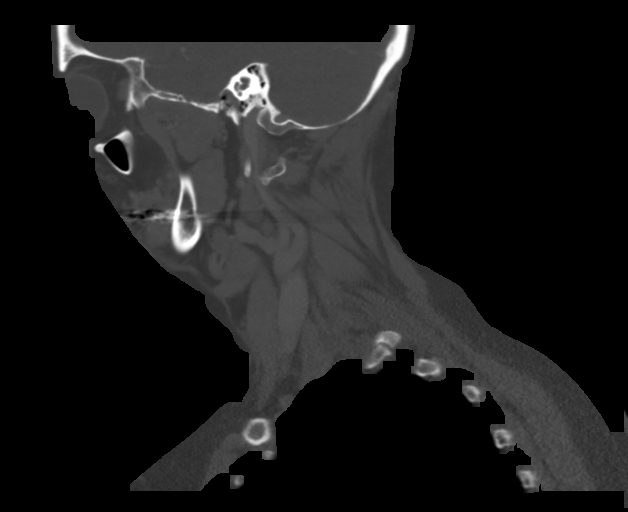

[Series 7: cor neck · coronal · 0.46mm/px · 3 of 107 slices shown]
[im 30/107  bone]
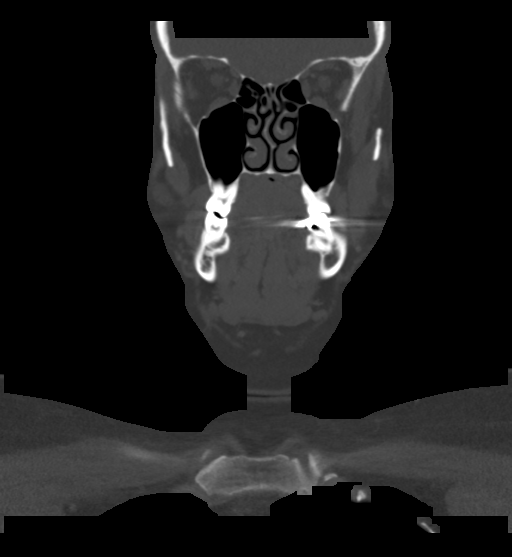
[im 46/107  bone]
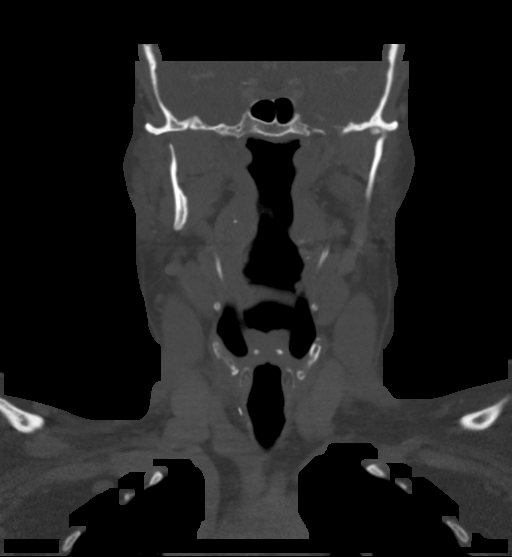
[im 61/107  bone]
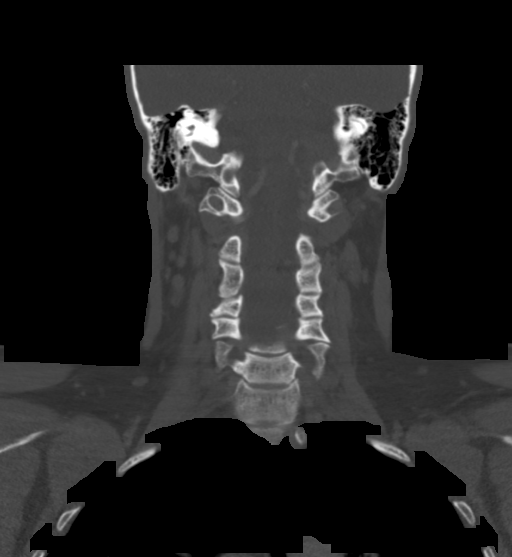

[Series 8: ax oropharynx · axial · 0.47mm/px · z∈[-282,-95]mm · 6 of 143 slices shown, 8 images]
[im 21/143  soft-tissue]
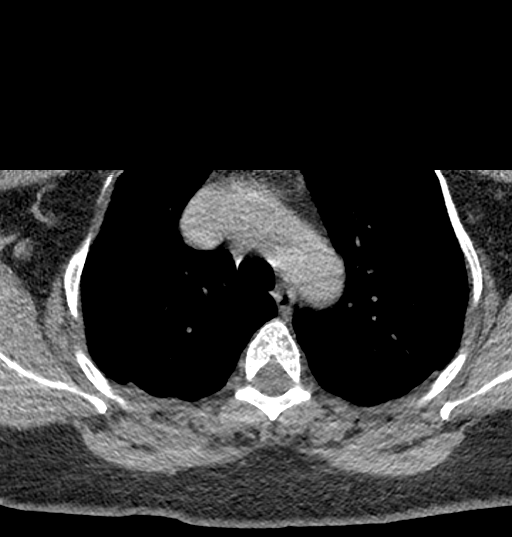
[im 21/143  bone]
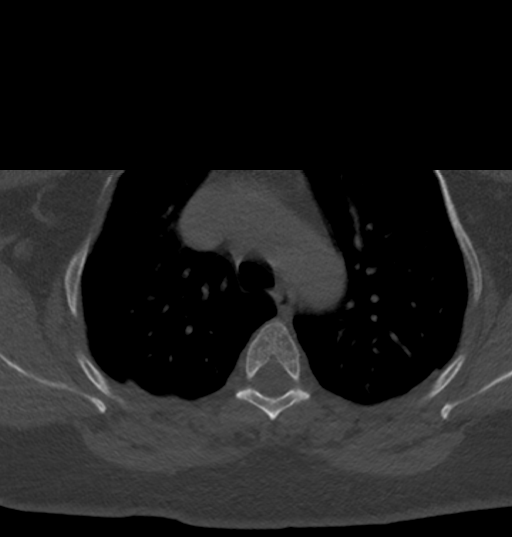
[im 41/143  bone]
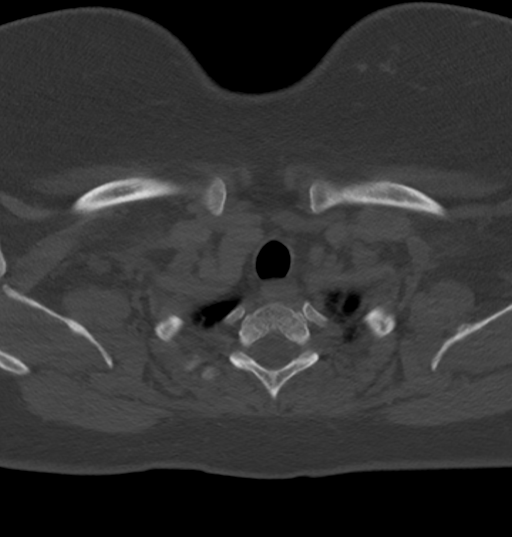
[im 61/143  bone]
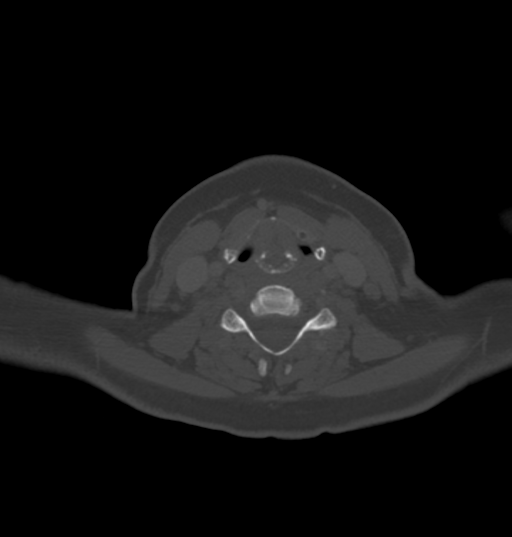
[im 82/143  bone]
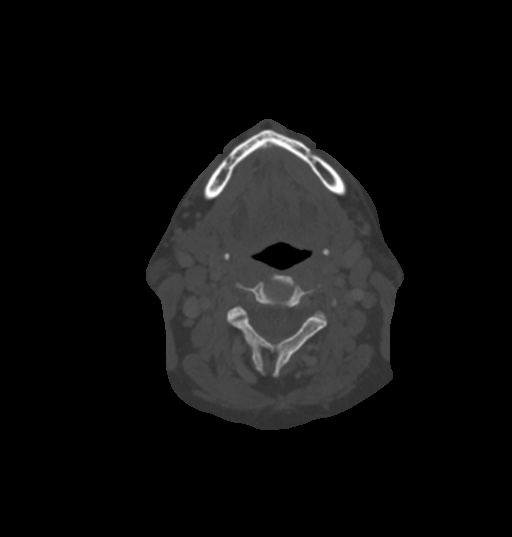
[im 102/143  soft-tissue]
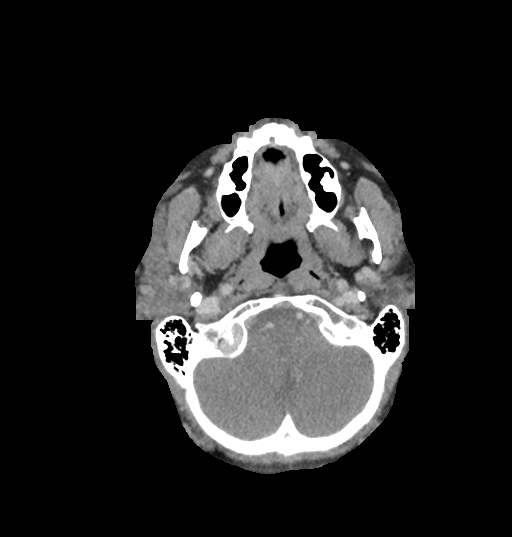
[im 102/143  bone]
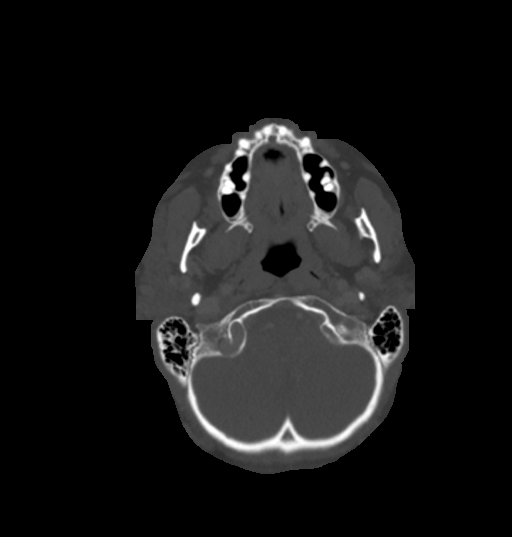
[im 122/143  bone]
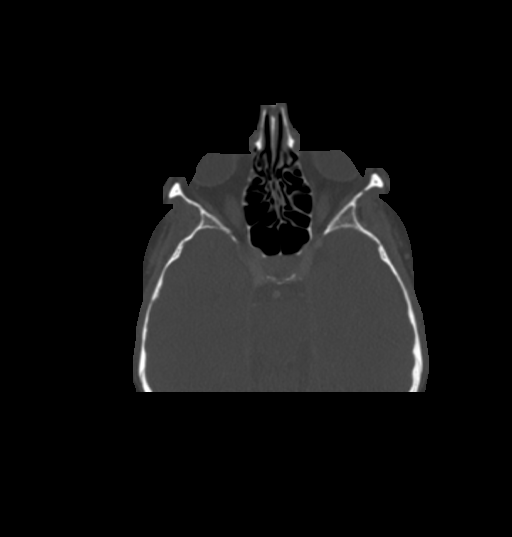

[14 of 33 positions shown; findings below may reference images not displayed]

FINDINGS: Pharynx and larynx: Unremarkable.  No mass or swelling.

Salivary glands: Parotid and submandibular glands are.

Thyroid: Absent.

Lymph nodes: In the right posterior auricular region, there is a
cm enhancing lesion probably reflecting a lymph node. Bilateral
nonenlarged cervical and supraclavicular nodes.

Vascular: Major neck vessels are patent.

Limited intracranial: No abnormal enhancement.

Visualized orbits: Unremarkable.

Mastoids and visualized paranasal sinuses: Aerated.

Skeleton: Degenerative changes of the temporomandibular joints.

Upper chest: Included upper lungs are clear.

Other: None.
IMPRESSION: 1.1 cm lesion in the right posterior auricular region probably
reflects palpable abnormality. Favored to reflect a mildly enlarged
lymph node. Suggest clinical follow-up given this is palpable.

There is no evidence otomastoiditis.

## 2021-10-31 ENCOUNTER — Ambulatory Visit (INDEPENDENT_AMBULATORY_CARE_PROVIDER_SITE_OTHER): Payer: 59

## 2021-10-31 DIAGNOSIS — Z1231 Encounter for screening mammogram for malignant neoplasm of breast: Secondary | ICD-10-CM | POA: Diagnosis not present

## 2022-02-12 DIAGNOSIS — Z86718 Personal history of other venous thrombosis and embolism: Secondary | ICD-10-CM | POA: Insufficient documentation

## 2022-02-20 ENCOUNTER — Encounter: Payer: Self-pay | Admitting: Nurse Practitioner

## 2022-02-20 ENCOUNTER — Ambulatory Visit: Payer: 59 | Admitting: Nurse Practitioner

## 2022-02-20 VITALS — BP 142/100 | HR 100 | Temp 98.5°F | Ht 66.0 in | Wt 236.8 lb

## 2022-02-20 DIAGNOSIS — R350 Frequency of micturition: Secondary | ICD-10-CM

## 2022-02-20 DIAGNOSIS — R109 Unspecified abdominal pain: Secondary | ICD-10-CM | POA: Diagnosis not present

## 2022-02-20 DIAGNOSIS — R1031 Right lower quadrant pain: Secondary | ICD-10-CM | POA: Diagnosis not present

## 2022-02-20 DIAGNOSIS — R030 Elevated blood-pressure reading, without diagnosis of hypertension: Secondary | ICD-10-CM

## 2022-02-20 LAB — POCT URINALYSIS DIPSTICK
Bilirubin, UA: NEGATIVE
Blood, UA: NEGATIVE
Glucose, UA: NEGATIVE
Ketones, UA: NEGATIVE
Leukocytes, UA: NEGATIVE
Nitrite, UA: NEGATIVE
Protein, UA: NEGATIVE
Spec Grav, UA: 1.015 (ref 1.010–1.025)
Urobilinogen, UA: 0.2 E.U./dL
pH, UA: 6 (ref 5.0–8.0)

## 2022-02-20 MED ORDER — NITROFURANTOIN MONOHYD MACRO 100 MG PO CAPS: 100.0000 mg | ORAL_CAPSULE | Freq: Two times a day (BID) | ORAL | 0 refills | Status: DC

## 2022-02-20 NOTE — Patient Instructions (Addendum)
Normal urinalysis Urine sent for culture Start Macrobid, maintain adequate oral hydration You will be contacted to schedule appt for CT scan  Monitor BP at home in AM Call PCP if BP remains >140/80

## 2022-02-20 NOTE — Progress Notes (Signed)
Established Patient Visit  Patient: Amy Mccall   DOB: 08/18/1975   46 y.o. Female  MRN: [id number] Visit Date: 02/20/2022  Subjective:    Chief Complaint  Patient presents with   Acute Visit    C/o possible kidney stone, has been having lower back pain & frequent urination  Denies blood in urine  Flu shot given today    Flank Pain This is a new problem. The current episode started in the past 7 days. The problem occurs constantly. The problem has been waxing and waning since onset. The pain is present in the lumbar spine. The quality of the pain is described as cramping. The pain does not radiate. The pain is moderate. The pain is The same all the time. Exacerbated by: nothing. Pertinent negatives include no abdominal pain, bladder incontinence, bowel incontinence, chest pain, dysuria, fever, headaches, leg pain, numbness, paresis, paresthesias, pelvic pain, perianal numbness, tingling, weakness or weight loss. (Urinary frequency) Risk factors include obesity and sedentary lifestyle. She has tried nothing for the symptoms.   Onset of symptoms on Tuesday, intermittent right lower back pain, feels like pressure and cramping. No hx of UTI, No pelvic pain, no vaginal symptoms, No injury.  BP Readings from Last 3 Encounters:  02/20/22 (!) 142/100  03/04/21 138/86  08/20/20 (!) 152/100    Reviewed medical, surgical, and social history today  Medications: Outpatient Medications Prior to Visit  Medication Sig   aspirin EC 81 MG tablet Take 81 mg by mouth daily.   cetirizine (ZYRTEC) 10 MG tablet Take 10 mg by mouth daily as needed for allergies.   cholecalciferol (VITAMIN D) 1000 units tablet Take 1,000 Units by mouth daily.   esomeprazole (NEXIUM) 20 MG capsule Take 20 mg by mouth daily at 12 noon.   fluticasone (FLONASE) 50 MCG/ACT nasal spray Place 2 sprays into both nostrils daily.   levothyroxine (SYNTHROID) 137 MCG tablet Take 1 tablet (137 mcg total) by mouth daily  before breakfast.   norethindrone (MICRONOR,CAMILA,ERRIN) 0.35 MG tablet Take 1 tablet by mouth daily.   venlafaxine XR (EFFEXOR-XR) 75 MG 24 hr capsule TAKE 1 CAPSULE BY MOUTH EVERY MORNING WITH BREAKFAST   No facility-administered medications prior to visit.   Reviewed past medical and social history.   ROS per HPI above      Objective:  BP (!) 142/100   Pulse 100   Temp 98.5 F (36.9 C) (Temporal)   Ht '5\' 6"'$  (1.676 m)   Wt 236 lb 12.8 oz (107.4 kg)   SpO2 95%   BMI 38.22 kg/m      Physical Exam Pulmonary:     Effort: Pulmonary effort is normal.  Abdominal:     General: There is no distension.     Palpations: Abdomen is soft. There is no mass.     Tenderness: There is no abdominal tenderness. There is right CVA tenderness. There is no left CVA tenderness.  Musculoskeletal:        General: Tenderness present. No signs of injury.  Skin:    General: Skin is warm and dry.     Findings: No erythema or rash.  Neurological:     Mental Status: She is alert and oriented to person, place, and time.     Results for orders placed or performed in visit on 02/20/22  POCT Urinalysis Dipstick  Result Value Ref Range   Color, UA Yellow  Clarity, UA Clear    Glucose, UA Negative Negative   Bilirubin, UA Negative    Ketones, UA Negative    Spec Grav, UA 1.015 1.010 - 1.025   Blood, UA Negative    pH, UA 6.0 5.0 - 8.0   Protein, UA Negative Negative   Urobilinogen, UA 0.2 0.2 or 1.0 E.U./dL   Nitrite, UA Negative    Leukocytes, UA Negative Negative   Appearance Clear    Odor None       Assessment & Plan:    Problem List Items Addressed This Visit   None Visit Diagnoses     Frequent urination    -  Primary   Relevant Medications   nitrofurantoin, macrocrystal-monohydrate, (MACROBID) 100 MG capsule   Other Relevant Orders   POCT Urinalysis Dipstick (Completed)   Urine Culture   CT RENAL STONE STUDY   Acute right flank pain       Relevant Orders   Urine Culture    CT RENAL STONE STUDY   Right lower quadrant abdominal pain         Normal urinalysis Urine sent for culture Start Macrobid, maintain adequate oral hydration You will be contacted to schedule appt for CT scan Monitor BP at home in AM Call PCP if BP remains >140/80 Return if symptoms worsen or fail to improve.     Wilfred Lacy, NP

## 2022-02-21 ENCOUNTER — Ambulatory Visit
Admission: RE | Admit: 2022-02-21 | Discharge: 2022-02-21 | Disposition: A | Discharge status: Home / Self Care | Payer: 59 | Source: Ambulatory Visit | Attending: Nurse Practitioner | Admitting: Nurse Practitioner

## 2022-02-21 DIAGNOSIS — R109 Unspecified abdominal pain: Secondary | ICD-10-CM

## 2022-02-21 DIAGNOSIS — R350 Frequency of micturition: Secondary | ICD-10-CM

## 2022-02-21 LAB — URINE CULTURE
MICRO NUMBER:: 14073998
Result:: NO GROWTH
SPECIMEN QUALITY:: ADEQUATE

## 2022-03-10 ENCOUNTER — Ambulatory Visit (INDEPENDENT_AMBULATORY_CARE_PROVIDER_SITE_OTHER): Payer: 59 | Admitting: Family Medicine

## 2022-03-10 ENCOUNTER — Encounter: Payer: Self-pay | Admitting: Family Medicine

## 2022-03-10 VITALS — BP 142/92 | HR 93 | Temp 98.5°F | Ht 66.0 in | Wt 238.4 lb

## 2022-03-10 DIAGNOSIS — E890 Postprocedural hypothyroidism: Secondary | ICD-10-CM | POA: Diagnosis not present

## 2022-03-10 DIAGNOSIS — F411 Generalized anxiety disorder: Secondary | ICD-10-CM

## 2022-03-10 DIAGNOSIS — Z23 Encounter for immunization: Secondary | ICD-10-CM

## 2022-03-10 DIAGNOSIS — Z85850 Personal history of malignant neoplasm of thyroid: Secondary | ICD-10-CM | POA: Diagnosis not present

## 2022-03-10 DIAGNOSIS — Z1211 Encounter for screening for malignant neoplasm of colon: Secondary | ICD-10-CM

## 2022-03-10 DIAGNOSIS — R030 Elevated blood-pressure reading, without diagnosis of hypertension: Secondary | ICD-10-CM | POA: Diagnosis not present

## 2022-03-10 DIAGNOSIS — Z0000 Encounter for general adult medical examination without abnormal findings: Secondary | ICD-10-CM

## 2022-03-10 LAB — LIPID PANEL
Cholesterol: 217 mg/dL — ABNORMAL HIGH (ref 0–200)
HDL: 61.5 mg/dL (ref >39.00)
LDL Cholesterol: 137 mg/dL — ABNORMAL HIGH (ref 0–99)
NonHDL: 155.66
Total CHOL/HDL Ratio: 4
Triglycerides: 91 mg/dL (ref 0.0–149.0)
VLDL: 18.2 mg/dL (ref 0.0–40.0)

## 2022-03-10 LAB — COMPREHENSIVE METABOLIC PANEL
ALT: 18 U/L (ref 0–35)
AST: 16 U/L (ref 0–37)
Albumin: 4.2 g/dL (ref 3.5–5.2)
Alkaline Phosphatase: 94 U/L (ref 39–117)
BUN: 9 mg/dL (ref 6–23)
CO2: 26 mEq/L (ref 19–32)
Calcium: 9 mg/dL (ref 8.4–10.5)
Chloride: 102 mEq/L (ref 96–112)
Creatinine, Ser: 0.76 mg/dL (ref 0.40–1.20)
GFR: 94.3 mL/min (ref >60.00)
Glucose, Bld: 99 mg/dL (ref 70–99)
Potassium: 4 mEq/L (ref 3.5–5.1)
Sodium: 137 mEq/L (ref 135–145)
Total Bilirubin: 0.7 mg/dL (ref 0.2–1.2)
Total Protein: 7 g/dL (ref 6.0–8.3)

## 2022-03-10 LAB — CBC
HCT: 37.2 % (ref 36.0–46.0)
Hemoglobin: 12.4 g/dL (ref 12.0–15.0)
MCHC: 33.2 g/dL (ref 30.0–36.0)
MCV: 80.9 fl (ref 78.0–100.0)
Platelets: 348 10*3/uL (ref 150.0–400.0)
RBC: 4.6 Mil/uL (ref 3.87–5.11)
RDW: 15.5 % (ref 11.5–15.5)
WBC: 8.4 10*3/uL (ref 4.0–10.5)

## 2022-03-10 LAB — TSH: TSH: 0.13 u[IU]/mL — ABNORMAL LOW (ref 0.35–5.50)

## 2022-03-10 LAB — T4, FREE: Free T4: 0.95 ng/dL (ref 0.60–1.60)

## 2022-03-10 MED ORDER — VENLAFAXINE HCL ER 75 MG PO CP24: 75.0000 mg | ORAL_CAPSULE | Freq: Every day | ORAL | 3 refills | Status: DC

## 2022-03-10 MED ORDER — LEVOTHYROXINE SODIUM 137 MCG PO TABS: 137.0000 ug | ORAL_TABLET | Freq: Every day | ORAL | 3 refills | Status: DC

## 2022-03-10 NOTE — Addendum Note (Signed)
Addended by: Sharon Seller B on: 03/10/2022 09:49 AM   Modules accepted: Orders

## 2022-03-10 NOTE — Patient Instructions (Addendum)
Give us 2-3 business days to get the results of your labs back.   Keep the diet clean and stay active.  If you do not hear anything about your referral in the next 1-2 weeks, call our office and ask for an update.  Let us know if you need anything. 

## 2022-03-10 NOTE — Progress Notes (Addendum)
Chief Complaint  Patient presents with   Annual Exam     Well Woman Amy Mccall is here for a complete physical.   Her last physical was >1 year ago.  Current diet: in general, an "awful" diet. Current exercise: walking. Weight is stable and she denies fatigue out of ordinary. Seatbelt? Yes Advanced directive? No  Health Maintenance Pap/HPV- Yes CCS- Due Mammogram- Yes Tetanus- Yes Hep C screening- Yes HIV screening- Yes  Generalized anxiety-patient has a history of anxiety adequately treated with venlafaxine XR 75 mg/d.  She reports compliance and no adverse effects.  [place] and she has not found a new one, but is not sure if she needs one.  No homicidal or suicidal ideation.  No self-medication.  Hypothyroidism Patient presents for follow-up of hypothyroidism.  Reports compliance with medication-levothyroxine 137 mcg daily. Current symptoms include: denies fatigue, weight changes, heat/cold intolerance, bowel/skin changes or CVS symptoms She believes her dose should be not significantly changed She does have a history of thyroid cancer and has her thyroglobulin antibodies checked yearly with the assumption that if they come back positive, she will have to see her thyroid specialist again.  Past Medical History:  Diagnosis Date   Generalized anxiety disorder    Follows with a counselor   GERD (gastroesophageal reflux disease)    PE (pulmonary thromboembolism) ([place]) 2014   Started with DVT   Seasonal allergies    Thyroid cancer ([place])    Removed thyroid in Aug, 2017, radiation in October     Past Surgical History:  Procedure Laterality Date   BIOPSY THYROID     CHOLECYSTECTOMY     THYROIDECTOMY  2017    Medications  Current Outpatient Medications on File Prior to Visit  Medication Sig Dispense Refill   aspirin EC 81 MG tablet Take 81 mg by mouth daily.     cetirizine (ZYRTEC) 10 MG tablet Take 10 mg by mouth daily as needed for allergies.      cholecalciferol (VITAMIN D) 1000 units tablet Take 1,000 Units by mouth daily.     esomeprazole (NEXIUM) 20 MG capsule Take 20 mg by mouth daily at 12 noon.     fluticasone (FLONASE) 50 MCG/ACT nasal spray Place 2 sprays into both nostrils daily. 16 g 2   norethindrone (MICRONOR,CAMILA,ERRIN) 0.35 MG tablet Take 1 tablet by mouth daily.      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   Penicillins Rash    Childhood allergy Childhood allergy Childhood allergy    Review of Systems: Constitutional:  no unexpected weight changes Eye:  no recent significant change in vision Ear/Nose/Mouth/Throat:  Ears:  no recent change in hearing Nose/Mouth/Throat:  no complaints of nasal congestion, no sore throat Cardiovascular: no chest pain Respiratory:  no shortness of breath Gastrointestinal:  no abdominal pain, no change in bowel habits GU:  Female: negative for dysuria or pelvic pain Musculoskeletal/Extremities:  no pain of the joints Integumentary (Skin/Breast):  no abnormal skin lesions reported Neurologic:  no headaches Endocrine:  denies fatigue Hematologic/Lymphatic:  No areas of easy bleeding  Exam BP (!) 142/92 (BP Location: Left Arm, Cuff Size: Large)   Pulse 93   Temp 98.5 F (36.9 C) (Oral)   Ht '5\' 6"'$  (1.676 m)   Wt 238 lb 6 oz (108.1 kg)   SpO2 98%  BMI 38.47 kg/m  General:  well developed, well nourished, in no apparent distress Skin:  no significant moles, warts, or growths Head:  no masses, lesions, or tenderness Eyes:  pupils equal and round, sclera anicteric without injection Ears:  canals without lesions, TMs shiny without retraction, no obvious effusion, no erythema Nose:  nares patent, mucosa normal, and no drainage Throat/Pharynx:  lips and gingiva without lesion; tongue and uvula midline;  non-inflamed pharynx; no exudates or postnasal drainage Neck: neck supple without adenopathy, thyromegaly, or masses Lungs:  clear to auscultation, breath sounds equal bilaterally, no respiratory distress Cardio:  regular rate and rhythm, no LE edema Abdomen:  abdomen soft, nontender; bowel sounds normal; no masses or organomegaly Genital: Defer to GYN Musculoskeletal:  symmetrical muscle groups noted without atrophy or deformity Extremities:  no clubbing, cyanosis, or edema, no deformities, no skin discoloration Neuro:  gait normal; deep tendon reflexes normal and symmetric Psych: well oriented with normal range of affect and appropriate judgment/insight  Assessment and Plan  Well adult exam - Plan: CBC, Comprehensive metabolic panel, Lipid panel  Screen for colon cancer - Plan: Ambulatory referral to Gastroenterology  GAD (generalized anxiety disorder) - Plan: venlafaxine XR (EFFEXOR-XR) 75 MG 24 hr capsule  Postoperative hypothyroidism - Plan: TSH, T4, free  History of thyroid cancer - Plan: Thyroglobulin antibody  Post-operative hypothyroidism - Plan: levothyroxine (SYNTHROID) 137 MCG tablet  Elevated blood pressure reading   Well 46 y.o. female. Counseled on diet and exercise. Advanced directive form provided today.  CCS: Referral to GI team today.  Hypothyroid: Chronic, stable.  Continue levothyroxine 137 mcg daily.  Check thyroglobulin antibody. GAD: Chronic, stable.  Continue Effexor XR 75 mg daily.  I do not think she necessarily needs to see a therapist at this time unless she wishes to.  She will reach out to me if she would like any resources. Elevated blood pressure reading: She has admittedly not been eating healthy.  She would prefer to avoid medication so she will focus on losing weight.  Her mother has a blood pressure monitor which she will use to check at home.  If it does not come down, she will let us know and we will proceed accordingly. Flu shot today.  Other  orders as above. Follow up 1 year or prn. The patient voiced understanding and agreement to the plan.  [place], DO 03/10/22 9:35 AM

## 2022-03-11 LAB — THYROGLOBULIN ANTIBODY: Thyroglobulin Ab: 1 IU/mL (ref <=1)

## 2022-03-24 ENCOUNTER — Telehealth (INDEPENDENT_AMBULATORY_CARE_PROVIDER_SITE_OTHER): Payer: 59 | Admitting: Family Medicine

## 2022-03-24 ENCOUNTER — Encounter: Payer: Self-pay | Admitting: Family Medicine

## 2022-03-24 DIAGNOSIS — J0100 Acute maxillary sinusitis, unspecified: Secondary | ICD-10-CM

## 2022-03-24 MED ORDER — PREDNISONE 20 MG PO TABS: 40.0000 mg | ORAL_TABLET | Freq: Every day | ORAL | 0 refills | Status: AC

## 2022-03-24 MED ORDER — DOXYCYCLINE HYCLATE 100 MG PO TABS: 100.0000 mg | ORAL_TABLET | Freq: Two times a day (BID) | ORAL | 0 refills | Status: DC

## 2022-03-24 NOTE — Progress Notes (Signed)
Chief Complaint  Patient presents with   Sinusitis    Facial pain on one side     SELISA Amy Mccall here for URI complaints. Due to COVID-19 pandemic, we are interacting via web portal for an electronic face-to-face visit. I verified patient's ID using 2 identifiers. Patient agreed to proceed with visit via this method. Patient is at home, I am at office. Patient and I are present for visit.   Duration:  12  days  Associated symptoms: sinus congestion, sinus pain, and dental pain Denies: rhinorrhea, itchy watery eyes, ear pain, ear drainage, sore throat, wheezing, shortness of breath, myalgia, and fevers Treatment to date: Sudafed, Advil Sick contacts: No  Past Medical History:  Diagnosis Date   Generalized anxiety disorder    Follows with a counselor   GERD (gastroesophageal reflux disease)    PE (pulmonary thromboembolism) ([place]) 2014   Started with DVT   Seasonal allergies    Thyroid cancer ([place])    Removed thyroid in Aug, 2017, radiation in October    Objective No conversational dyspnea Age appropriate judgment and insight Nml affect and mood  Acute maxillary sinusitis, recurrence not specified - Plan: doxycycline (VIBRA-TABS) 100 MG tablet, predniSONE (DELTASONE) 20 MG tablet  Given duration, will tx w 7 d of doxycycline. 5 d pred burst 40 mg/d as well. Continue to push fluids, practice good hand hygiene, cover mouth when coughing. F/u prn. If starting to experience fevers, shaking, or shortness of breath, seek immediate care. Pt voiced understanding and agreement to the plan.  [place], DO 03/24/22 12:03 PM

## 2022-04-03 ENCOUNTER — Encounter: Payer: Self-pay | Admitting: Family Medicine

## 2022-04-03 NOTE — Telephone Encounter (Signed)
Allergies updated.  

## 2022-05-08 ENCOUNTER — Telehealth: Payer: Self-pay | Admitting: Family Medicine

## 2022-05-08 NOTE — Telephone Encounter (Signed)
Patient called to say she got a bill for a copay for her visit on 03/10/2022 but it was her physical. Patient said per her insurance and the bill itself, it says Established patient, moderate > than 30 minutes. Please review and advise patient.

## 2022-05-26 NOTE — Telephone Encounter (Signed)
Called pt but was unable to leave a VM. Will attempt to call back.   She was billed a copy because her thyroid condition and elevated BP were discussed. This is outside the scope of a CPE so an additional OV was billed in addition to the CPE.

## 2022-09-19 ENCOUNTER — Other Ambulatory Visit: Payer: Self-pay | Admitting: Obstetrics and Gynecology

## 2022-09-19 DIAGNOSIS — Z1231 Encounter for screening mammogram for malignant neoplasm of breast: Secondary | ICD-10-CM

## 2022-11-12 ENCOUNTER — Ambulatory Visit: Payer: 59

## 2022-12-10 ENCOUNTER — Ambulatory Visit: Payer: 59

## 2022-12-11 ENCOUNTER — Ambulatory Visit: Payer: 59

## 2022-12-11 DIAGNOSIS — Z1231 Encounter for screening mammogram for malignant neoplasm of breast: Secondary | ICD-10-CM | POA: Diagnosis not present

## 2022-12-22 ENCOUNTER — Other Ambulatory Visit: Payer: Self-pay | Admitting: Family Medicine

## 2022-12-22 DIAGNOSIS — F411 Generalized anxiety disorder: Secondary | ICD-10-CM

## 2022-12-22 DIAGNOSIS — E890 Postprocedural hypothyroidism: Secondary | ICD-10-CM

## 2023-03-02 ENCOUNTER — Ambulatory Visit (INDEPENDENT_AMBULATORY_CARE_PROVIDER_SITE_OTHER): Payer: 59 | Admitting: Obstetrics & Gynecology

## 2023-03-02 ENCOUNTER — Encounter: Payer: Self-pay | Admitting: Obstetrics & Gynecology

## 2023-03-02 VITALS — BP 161/90 | HR 77 | Resp 16 | Ht 66.0 in | Wt 238.0 lb

## 2023-03-02 DIAGNOSIS — Z803 Family history of malignant neoplasm of breast: Secondary | ICD-10-CM | POA: Diagnosis not present

## 2023-03-02 DIAGNOSIS — Z01419 Encounter for gynecological examination (general) (routine) without abnormal findings: Secondary | ICD-10-CM | POA: Diagnosis not present

## 2023-03-02 DIAGNOSIS — Z23 Encounter for immunization: Secondary | ICD-10-CM | POA: Diagnosis not present

## 2023-03-02 MED ORDER — NORETHINDRONE 0.35 MG PO TABS: 1.0000 | ORAL_TABLET | Freq: Every day | ORAL | 13 refills | Status: DC

## 2023-03-02 NOTE — Progress Notes (Signed)
  Subjective:     Amy Mccall is a 47 y.o. female here for a routine exam.  Current complaints: none--establishing new gyn care.    Gynecologic History No LMP recorded. (Menstrual status: Oral contraceptives). Contraception: POPs--Hx of DVT/PE Last Mammogram: 8/24 Last Pap Smear:  10/23 Last Colon Screening;  never Seat Belts:   yes Sun Screen:   yes Dental Check Up:  yes Brush & Floss:  yes   Obstetric History OB History  Gravida Para Term Preterm AB Living  1 1          SAB IAB Ectopic Multiple Live Births          1    # Outcome Date GA Lbr Len/2nd Weight Sex Type Anes PTL Lv  1 Para              The following portions of the patient's history were reviewed and updated as appropriate: allergies, current medications, past family history, past medical history, past social history, past surgical history, and problem list.  Review of Systems Pertinent items noted in HPI and remainder of comprehensive ROS otherwise negative.    Objective:     Vitals:   03/02/23 0959  BP: (!) 162/92  Pulse: 94  Resp: 16  Weight: 238 lb (108 kg)  Height: 5\' 6"  (1.676 m)   Vitals:  WNL General appearance: alert, cooperative and no distress  HEENT: Normocephalic, without obvious abnormality, atraumatic Eyes: negative Throat: lips, mucosa, and tongue normal; teeth and gums normal  Respiratory: Clear to auscultation bilaterally  CV: Regular rate and rhythm  Breasts:  Normal appearance, no masses or tenderness, no nipple retraction or dimpling  GI: Soft, non-tender; bowel sounds normal; no masses,  no organomegaly  GU: External Genitalia:  Tanner V, no lesion Urethra:  No prolapse   Vagina: Pink, normal rugae, no blood or discharge  Cervix: No CMT, no lesion  Uterus:  Normal size and contour, non tender  Adnexa: Normal, no masses, non tender  Musculoskeletal: No edema, redness or tenderness in the calves or thighs  Skin: No lesions or rash  Lymphatic: Axillary adenopathy: none      Psychiatric: Normal mood and behavior        Assessment:    Healthy female exam.    Plan:    1.  Pap up to date 2. Mammogram yearly and up to date 3.  Colon cancer screening--Pt will schedule (is aware that screening starts at 45) 4.  HTN--has appt in 2 weeks with PCP; Rpt today is the same. 5.  Premenopausal--Presently on progesterone only oral contraception She has had a history of DVT and pulmonary emboli--in 2014--underwent IVC filter placement Thromboembolism was provoked by immobilized sprained ankle She denies any breakthrough bleeding Doing well with the norethindrone--Refills given 6.  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  Mom diagnosed with breast cancer last year--negative genetics per patient.

## 2023-03-16 ENCOUNTER — Encounter: Payer: Self-pay | Admitting: Family Medicine

## 2023-03-16 ENCOUNTER — Ambulatory Visit (INDEPENDENT_AMBULATORY_CARE_PROVIDER_SITE_OTHER): Payer: 59 | Admitting: Family Medicine

## 2023-03-16 VITALS — BP 140/86 | HR 102 | Temp 98.0°F | Resp 16 | Ht 66.0 in | Wt 236.2 lb

## 2023-03-16 DIAGNOSIS — E890 Postprocedural hypothyroidism: Secondary | ICD-10-CM

## 2023-03-16 DIAGNOSIS — Z1211 Encounter for screening for malignant neoplasm of colon: Secondary | ICD-10-CM

## 2023-03-16 DIAGNOSIS — Z13220 Encounter for screening for lipoid disorders: Secondary | ICD-10-CM | POA: Diagnosis not present

## 2023-03-16 DIAGNOSIS — R7303 Prediabetes: Secondary | ICD-10-CM

## 2023-03-16 DIAGNOSIS — Z0000 Encounter for general adult medical examination without abnormal findings: Secondary | ICD-10-CM

## 2023-03-16 DIAGNOSIS — F411 Generalized anxiety disorder: Secondary | ICD-10-CM

## 2023-03-16 LAB — LIPID PANEL
Cholesterol: 242 mg/dL — ABNORMAL HIGH (ref 0–200)
HDL: 62.8 mg/dL (ref >39.00)
LDL Cholesterol: 161 mg/dL — ABNORMAL HIGH (ref 0–99)
NonHDL: 178.75
Total CHOL/HDL Ratio: 4
Triglycerides: 89 mg/dL (ref 0.0–149.0)
VLDL: 17.8 mg/dL (ref 0.0–40.0)

## 2023-03-16 LAB — COMPREHENSIVE METABOLIC PANEL
ALT: 19 U/L (ref 0–35)
AST: 16 U/L (ref 0–37)
Albumin: 4.4 g/dL (ref 3.5–5.2)
Alkaline Phosphatase: 109 U/L (ref 39–117)
BUN: 11 mg/dL (ref 6–23)
CO2: 26 meq/L (ref 19–32)
Calcium: 9.2 mg/dL (ref 8.4–10.5)
Chloride: 103 meq/L (ref 96–112)
Creatinine, Ser: 0.75 mg/dL (ref 0.40–1.20)
GFR: 95.13 mL/min (ref >60.00)
Glucose, Bld: 104 mg/dL — ABNORMAL HIGH (ref 70–99)
Potassium: 4.2 meq/L (ref 3.5–5.1)
Sodium: 140 meq/L (ref 135–145)
Total Bilirubin: 0.9 mg/dL (ref 0.2–1.2)
Total Protein: 7.4 g/dL (ref 6.0–8.3)

## 2023-03-16 LAB — TSH: TSH: 0.25 u[IU]/mL — ABNORMAL LOW (ref 0.35–5.50)

## 2023-03-16 LAB — CBC
HCT: 39.8 % (ref 36.0–46.0)
Hemoglobin: 13.3 g/dL (ref 12.0–15.0)
MCHC: 33.4 g/dL (ref 30.0–36.0)
MCV: 83.5 fL (ref 78.0–100.0)
Platelets: 354 10*3/uL (ref 150.0–400.0)
RBC: 4.77 Mil/uL (ref 3.87–5.11)
RDW: 15.5 % (ref 11.5–15.5)
WBC: 8.3 10*3/uL (ref 4.0–10.5)

## 2023-03-16 LAB — HEMOGLOBIN A1C: Hgb A1c MFr Bld: 5.9 % (ref 4.6–6.5)

## 2023-03-16 LAB — T4, FREE: Free T4: 0.9 ng/dL (ref 0.60–1.60)

## 2023-03-16 MED ORDER — VENLAFAXINE HCL ER 150 MG PO CP24: 150.0000 mg | ORAL_CAPSULE | Freq: Every day | ORAL | 3 refills | Status: DC

## 2023-03-16 NOTE — Progress Notes (Signed)
Chief Complaint  Patient presents with   Annual Exam    Annual Exam     Well Woman Amy Mccall is here for a complete physical.   Her last physical was >1 year ago.  Current diet: in general, diet has been pretty good. Current exercise: some walking. Weight is stable and she denies fatigue out of ordinary. Seatbelt? Yes Advanced directive? No  Health Maintenance Pap/HPV- Yes Mammogram- Yes Tetanus- Yes Hep C screening- Yes HIV screening- Yes  Hypothyroidism Patient presents for follow-up of hypothyroidism.  Reports compliance with medication. Current symptoms include: denies fatigue, weight changes, heat/cold intolerance, bowel/skin changes or CVS symptoms She believes her dose should be not significantly changed  Anxiety The patient is currently taking Effexor XR 75 mg daily.  She reports compliance and no adverse effects.  She is following with a therapist.  No homicidal or suicidal ideation.  No self-medication.  She has been having more stress over the past couple weeks due to her son going through with some mental health issues.  She is interested in adjusting her medication.  She believes her blood pressure started to go up since this well.  Past Medical History:  Diagnosis Date   Generalized anxiety disorder    Follows with a counselor   GERD (gastroesophageal reflux disease)    PE (pulmonary thromboembolism) (HCC) 2014   Started with DVT   Seasonal allergies    Thyroid cancer (HCC)    Removed thyroid in Aug, 2017, radiation in October     Past Surgical History:  Procedure Laterality Date   BIOPSY THYROID     CHOLECYSTECTOMY     THYROIDECTOMY  2017    Medications  Current Outpatient Medications on File Prior to Visit  Medication Sig Dispense Refill   aspirin EC 81 MG tablet Take 81 mg by mouth daily.     cetirizine (ZYRTEC) 10 MG tablet Take 10 mg by mouth daily as needed for allergies.     cholecalciferol (VITAMIN D) 1000 units tablet Take 1,000 Units by  mouth daily.     esomeprazole (NEXIUM) 20 MG capsule Take 20 mg by mouth daily at 12 noon.     fluticasone (FLONASE) 50 MCG/ACT nasal spray Place 2 sprays into both nostrils daily. 16 g 2   levothyroxine (SYNTHROID) 137 MCG tablet TAKE 1 TABLET BY MOUTH EVERY MORNING BEFORE BREAKFAST 90 tablet 3   norethindrone (MICRONOR) 0.35 MG tablet Take 1 tablet (0.35 mg total) by mouth daily. 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   Doxycycline Rash   Penicillins Rash    Childhood allergy Childhood allergy Childhood allergy    Review of Systems: Constitutional:  no unexpected weight changes Eye:  no recent significant change in vision Ear/Nose/Mouth/Throat:  Ears:  no recent change in hearing Nose/Mouth/Throat:  no complaints of nasal congestion, no sore throat Cardiovascular: no chest pain Respiratory:  no shortness of breath Gastrointestinal:  no abdominal pain, no change in bowel habits GU:  Female: negative for dysuria or pelvic pain Musculoskeletal/Extremities:  no pain of the joints Integumentary (Skin/Breast):  no abnormal skin lesions reported Neurologic:  no headaches Endocrine:  denies fatigue Hematologic/Lymphatic:  No areas of easy bleeding  Exam BP (!) 140/86 (BP Location: Left Arm, Patient Position: Sitting, Cuff Size: Normal)   Pulse (!) 102   Temp 98  F (36.7 C) (Oral)   Resp 16   Ht 5\' 6"  (1.676 m)   Wt 236 lb 3.2 oz (107.1 kg)   SpO2 96%   BMI 38.12 kg/m  General:  well developed, well nourished, in no apparent distress Skin:  no significant moles, warts, or growths Head:  no masses, lesions, or tenderness Eyes:  pupils equal and round, sclera anicteric without injection Ears:  canals without lesions, TMs shiny without retraction, no obvious effusion, no erythema Nose:  nares  patent, mucosa normal, and no drainage Throat/Pharynx:  lips and gingiva without lesion; tongue and uvula midline; non-inflamed pharynx; no exudates or postnasal drainage Neck: neck supple without adenopathy, thyromegaly, or masses Lungs:  clear to auscultation, breath sounds equal bilaterally, no respiratory distress Cardio:  regular rate and rhythm, no LE edema Abdomen:  abdomen soft, nontender; bowel sounds normal; no masses or organomegaly Genital: Defer to GYN Musculoskeletal:  symmetrical muscle groups noted without atrophy or deformity Extremities:  no clubbing, cyanosis, or edema, no deformities, no skin discoloration Neuro:  gait normal; deep tendon reflexes normal and symmetric Psych: well oriented with normal range of affect and appropriate judgment/insight  Assessment and Plan  Well adult exam - Plan: CBC, Comprehensive metabolic panel, Lipid panel  Screen for colon cancer - Plan: Ambulatory referral to Gastroenterology  Prediabetes - Plan: Hemoglobin A1c  Postoperative hypothyroidism - Plan: Thyroglobulin antibody, TSH, T4, free  GAD (generalized anxiety disorder)   Well 47 y.o. female. Counseled on diet and exercise. Advanced directive form provided today.  Refer to GI again.  Thyroid: Chronic, stable. Cont levothyroxine 137 mcg/d.  Anxiety: Chronic, unstable. Increase Effexor XR 150 mg/d. F/u in 6 weeks. Cont w counseling team. The patient voiced understanding and agreement to the plan.  Jilda Roche [place], DO 03/16/23 10:14 AM

## 2023-03-16 NOTE — Patient Instructions (Signed)
Give us 2-3 business days to get the results of your labs back.   Keep the diet clean and stay active.  Please get me a copy of your advanced directive form at your convenience.   Let us know if you need anything.  

## 2023-03-18 LAB — THYROGLOBULIN ANTIBODY: Thyroglobulin Ab: 1 [IU]/mL (ref <=1)

## 2023-04-06 ENCOUNTER — Encounter: Payer: Self-pay | Admitting: Family Medicine

## 2023-04-06 ENCOUNTER — Other Ambulatory Visit: Payer: Self-pay | Admitting: Family Medicine

## 2023-04-06 MED ORDER — AMLODIPINE BESYLATE 5 MG PO TABS: 5.0000 mg | ORAL_TABLET | Freq: Every day | ORAL | 3 refills | Status: DC

## 2023-04-27 ENCOUNTER — Encounter: Payer: Self-pay | Admitting: Family Medicine

## 2023-04-27 ENCOUNTER — Ambulatory Visit: Payer: 59 | Admitting: Family Medicine

## 2023-04-27 VITALS — BP 146/94 | HR 104 | Temp 98.5°F | Resp 18 | Ht 66.0 in | Wt 232.6 lb

## 2023-04-27 DIAGNOSIS — F411 Generalized anxiety disorder: Secondary | ICD-10-CM | POA: Diagnosis not present

## 2023-04-27 DIAGNOSIS — I10 Essential (primary) hypertension: Secondary | ICD-10-CM | POA: Diagnosis not present

## 2023-04-27 MED ORDER — VENLAFAXINE HCL ER 150 MG PO CP24: 150.0000 mg | ORAL_CAPSULE | Freq: Every day | ORAL | 3 refills | Status: DC

## 2023-04-27 MED ORDER — AMLODIPINE BESYLATE 10 MG PO TABS: 10.0000 mg | ORAL_TABLET | Freq: Every day | ORAL | 1 refills | Status: DC

## 2023-04-27 NOTE — Patient Instructions (Signed)
Keep the diet clean and stay active.  Check your blood pressures 2-3 times per week, alternating the time of day you check it. If it is high, considering waiting 1-2 minutes and rechecking. If it gets higher, your anxiety is likely creeping up and we should avoid rechecking.   Let us know if you need anything.  

## 2023-04-27 NOTE — Progress Notes (Signed)
Chief Complaint  Patient presents with   Anxiety   Follow-up   Hypertension    Subjective Amy Mccall is a 47 y.o. female who presents for hypertension follow up. She does monitor home blood pressures. Blood pressures ranging from 140's/90's on average. She is compliant with medication- Norvasc 5 mg/d. Patient has these side effects of medication: none She is adhering to a healthy diet overall. Current exercise: elliptical  No CP or SOB.   GAD Recently had an increase of Effexor XR 150 mg/d from 75 mg/d. Feels it is helping significantly. No AE's, complaint. She is seeing a therapist. No SI or HI. No self medication.    Past Medical History:  Diagnosis Date   Generalized anxiety disorder    Follows with a counselor   GERD (gastroesophageal reflux disease)    PE (pulmonary thromboembolism) (HCC) 2014   Started with DVT   Seasonal allergies    Thyroid cancer (HCC)    Removed thyroid in Aug, 2017, radiation in October    Exam BP (!) 146/94 (BP Location: Left Arm, Cuff Size: Large)   Pulse (!) 104   Temp 98.5 F (36.9 C) (Oral)   Resp 18   Ht 5\' 6"  (1.676 m)   Wt 232 lb 9.6 oz (105.5 kg)   SpO2 98%   BMI 37.54 kg/m  General:  well developed, well nourished, in no apparent distress Heart: Tachycardic, regular rhythm, no bruits, no LE edema Lungs: clear to auscultation, no accessory muscle use Psych: well oriented with normal range of affect and appropriate judgment/insight  Essential hypertension  GAD (generalized anxiety disorder) - Plan: venlafaxine XR (EFFEXOR XR) 150 MG 24 hr capsule  Chronic, unstable.  Increase Norvasc from 5 mg daily to 10 mg daily.  Monitor blood pressure at home.  Counseled on diet and exercise. F/u in 1 month. Chronic, stable.  Continue venlafaxine XR 150 mg daily. The patient voiced understanding and agreement to the plan.  Amy Roche [place], DO 04/27/23  9:59 AM

## 2023-05-29 ENCOUNTER — Encounter: Payer: Self-pay | Admitting: Family Medicine

## 2023-05-29 ENCOUNTER — Ambulatory Visit: Payer: 59 | Admitting: Family Medicine

## 2023-05-29 VITALS — BP 136/82 | HR 100 | Temp 98.0°F | Resp 16 | Ht 66.0 in | Wt 238.8 lb

## 2023-05-29 DIAGNOSIS — I10 Essential (primary) hypertension: Secondary | ICD-10-CM

## 2023-05-29 DIAGNOSIS — J0100 Acute maxillary sinusitis, unspecified: Secondary | ICD-10-CM

## 2023-05-29 MED ORDER — PREDNISONE 20 MG PO TABS: 40.0000 mg | ORAL_TABLET | Freq: Every day | ORAL | 0 refills | Status: AC

## 2023-05-29 MED ORDER — OLMESARTAN MEDOXOMIL 20 MG PO TABS: 20.0000 mg | ORAL_TABLET | Freq: Every day | ORAL | 2 refills | Status: DC

## 2023-05-29 MED ORDER — AZITHROMYCIN 250 MG PO TABS: ORAL_TABLET | ORAL | 0 refills | Status: DC

## 2023-05-29 NOTE — Progress Notes (Signed)
Chief Complaint  Patient presents with   Follow-up    Follow up    Subjective Amy Mccall is a 48 y.o. female who presents for hypertension follow up. She does monitor home blood pressures. Blood pressures ranging from 140's/90's on average. She is compliant with medication- Norvasc 10 mg/d. Patient has these side effects of medication: none She is sometimes adhering to a healthy diet overall. Current exercise: walking,  No CP or SOB.   Duration: 3 weeks  Associated symptoms: sinus congestion, sinus pain, and rhinorrhea Denies: itchy watery eyes, ear pain, ear drainage, sore throat, wheezing, shortness of breath, myalgia, and fevers, cough Treatment to date: various OTC meds Sick contacts: No    Past Medical History:  Diagnosis Date   Generalized anxiety disorder    Follows with a counselor   GERD (gastroesophageal reflux disease)    PE (pulmonary thromboembolism) (HCC) 2014   Started with DVT   Seasonal allergies    Thyroid cancer (HCC)    Removed thyroid in Aug, 2017, radiation in October    Exam BP 136/82   Pulse 100   Temp 98 F (36.7 C) (Oral)   Resp 16   Ht 5\' 6"  (1.676 m)   Wt 238 lb 12.8 oz (108.3 kg)   SpO2 96%   BMI 38.54 kg/m  General:  well developed, well nourished, in no apparent distress Heart: RRR, no bruits, no LE edema Lungs: clear to auscultation, no accessory muscle use Psych: well oriented with normal range of affect and appropriate judgment/insight  Essential hypertension - Plan: olmesartan (BENICAR) 20 MG tablet, Basic metabolic panel  Acute maxillary sinusitis, recurrence not specified - Plan: predniSONE (DELTASONE) 20 MG tablet, azithromycin (ZITHROMAX) 250 MG tablet  Chronic, not controlled.  Continue Norvasc 10 mg daily.  Add Benicar 20 mg daily.  Monitor blood pressure at home.  Come back in 1 week for a BMP.  Counseled on diet and exercise.  Bring blood pressure monitor to next visit. Acute issue.  5-day prednisone burst.  If no  improvement in the next 2 days, will take a Z-Pak.  She has an allergy to both doxycycline and penicillins.  Supportive care otherwise. F/u in 1 mo. The patient voiced understanding and agreement to the plan.  Jilda Roche [place], DO 05/29/23  8:29 AM

## 2023-05-29 NOTE — Patient Instructions (Addendum)
Keep the diet clean and stay active.  Please consider adding some weight resistance exercise to your routine. Consider yoga as well.   Keep checking your blood pressures at home.   Please bring your blood pressure monitor to your next visit so we can validate its accuracy.  Start with the prednisone. If no better by Sunday, take the antibiotic.   Let us know if you need anything.

## 2023-06-08 ENCOUNTER — Other Ambulatory Visit: Payer: 59

## 2023-06-08 ENCOUNTER — Other Ambulatory Visit (INDEPENDENT_AMBULATORY_CARE_PROVIDER_SITE_OTHER): Payer: 59

## 2023-06-08 DIAGNOSIS — I10 Essential (primary) hypertension: Secondary | ICD-10-CM | POA: Diagnosis not present

## 2023-06-08 LAB — BASIC METABOLIC PANEL
BUN: 8 mg/dL (ref 6–23)
CO2: 26 meq/L (ref 19–32)
Calcium: 9 mg/dL (ref 8.4–10.5)
Chloride: 102 meq/L (ref 96–112)
Creatinine, Ser: 0.8 mg/dL (ref 0.40–1.20)
GFR: 87.9 mL/min (ref >60.00)
Glucose, Bld: 82 mg/dL (ref 70–99)
Potassium: 3.9 meq/L (ref 3.5–5.1)
Sodium: 138 meq/L (ref 135–145)

## 2023-06-09 ENCOUNTER — Encounter: Payer: Self-pay | Admitting: Family Medicine

## 2023-06-29 ENCOUNTER — Encounter: Payer: Self-pay | Admitting: Family Medicine

## 2023-06-29 ENCOUNTER — Ambulatory Visit: Payer: 59 | Admitting: Family Medicine

## 2023-06-29 VITALS — BP 130/78 | HR 99 | Temp 98.0°F | Resp 16 | Ht 66.0 in | Wt 239.2 lb

## 2023-06-29 DIAGNOSIS — I10 Essential (primary) hypertension: Secondary | ICD-10-CM | POA: Diagnosis not present

## 2023-06-29 MED ORDER — OLMESARTAN MEDOXOMIL 20 MG PO TABS: 20.0000 mg | ORAL_TABLET | Freq: Every day | ORAL | 2 refills | Status: DC

## 2023-06-29 NOTE — Progress Notes (Signed)
 Chief Complaint  Patient presents with   Follow-up    Follow up    Subjective Amy Mccall is a 48 y.o. female who presents for hypertension follow up. She does monitor home blood pressures. Blood pressures ranging from 120-130's/70's on average. She is compliant with medications- Norvasc 10 mg daily, olmesartan 20 mg daily. Patient has these side effects of medication: none She is usually adhering to a healthy diet overall. Current exercise: could be better No CP or SOB.    Past Medical History:  Diagnosis Date   Generalized anxiety disorder    Follows with a counselor   GERD (gastroesophageal reflux disease)    PE (pulmonary thromboembolism) (HCC) 2014   Started with DVT   Seasonal allergies    Thyroid cancer (HCC)    Removed thyroid in Aug, 2017, radiation in October    Exam BP 130/78 (BP Location: Left Arm, Patient Position: Sitting)   Pulse 99   Temp 98 F (36.7 C) (Oral)   Resp 16   Ht 5\' 6"  (1.676 m)   Wt 239 lb 3.2 oz (108.5 kg)   SpO2 97%   BMI 38.61 kg/m  General:  well developed, well nourished, in no apparent distress Heart: RRR, no bruits, no LE edema Lungs: clear to auscultation, no accessory muscle use Psych: well oriented with normal range of affect and appropriate judgment/insight  Essential hypertension  Chronic, stable.  Continue olmesartan 20 mg daily, amlodipine 10 mg daily.  Counseled on diet and exercise. F/u in 9 mo. The patient voiced understanding and agreement to the plan.  Jilda Roche [place], DO 06/29/23  9:46 AM

## 2023-06-29 NOTE — Patient Instructions (Signed)
 Keep the diet clean and stay active.  Let us know if you need anything.

## 2023-07-22 ENCOUNTER — Other Ambulatory Visit: Payer: Self-pay | Admitting: Family Medicine

## 2023-07-22 DIAGNOSIS — I10 Essential (primary) hypertension: Secondary | ICD-10-CM

## 2023-07-27 ENCOUNTER — Encounter: Payer: Self-pay | Admitting: Family Medicine

## 2023-07-27 DIAGNOSIS — I10 Essential (primary) hypertension: Secondary | ICD-10-CM

## 2023-07-27 DIAGNOSIS — F411 Generalized anxiety disorder: Secondary | ICD-10-CM

## 2023-07-27 MED ORDER — OLMESARTAN MEDOXOMIL 20 MG PO TABS: 20.0000 mg | ORAL_TABLET | Freq: Every day | ORAL | 0 refills | Status: DC

## 2023-07-27 MED ORDER — VENLAFAXINE HCL ER 150 MG PO CP24: 150.0000 mg | ORAL_CAPSULE | Freq: Every day | ORAL | 0 refills | Status: DC

## 2023-09-02 ENCOUNTER — Ambulatory Visit: Admitting: Family Medicine

## 2023-09-02 ENCOUNTER — Encounter: Payer: Self-pay | Admitting: Family Medicine

## 2023-09-02 VITALS — BP 130/78 | HR 112 | Temp 98.0°F | Resp 16 | Ht 66.0 in | Wt 242.0 lb

## 2023-09-02 DIAGNOSIS — B029 Zoster without complications: Secondary | ICD-10-CM | POA: Diagnosis not present

## 2023-09-02 MED ORDER — PREDNISONE 20 MG PO TABS
40.0000 mg | ORAL_TABLET | Freq: Every day | ORAL | 0 refills | Status: AC
Start: 2023-09-02 — End: 2023-09-07

## 2023-09-02 MED ORDER — VALACYCLOVIR HCL 1 G PO TABS
1000.0000 mg | ORAL_TABLET | Freq: Three times a day (TID) | ORAL | 0 refills | Status: AC
Start: 2023-09-02 — End: 2023-09-09

## 2023-09-02 NOTE — Patient Instructions (Signed)
 Send me a message in 2 days if not turning the corner.   Ice/cold pack over area for 10-15 min twice daily.  OK to take Tylenol  1000 mg (2 extra strength tabs) or 975 mg (3 regular strength tabs) every 6 hours as needed.  No ibuprofen  while on the prednisone .  Let us  know if you need anything.

## 2023-09-02 NOTE — Progress Notes (Signed)
 Chief Complaint  Patient presents with   Shoulder Pain    Shoulder pain    Amy Mccall is a 48 y.o. female here for a skin complaint.  Duration: 3 days Location: R shoulder Pruritic? Yes Painful? Yes- burning Drainage? No New soaps/lotions/topicals/detergents? No Trauma? No Other associated symptoms: very tender when anything touches it Therapies tried thus far: ibuprofen -did not help Denies sunburn.  Past Medical History:  Diagnosis Date   Generalized anxiety disorder    Follows with a counselor   GERD (gastroesophageal reflux disease)    PE (pulmonary thromboembolism) (HCC) 2014   Started with DVT   Seasonal allergies    Thyroid  cancer (HCC)    Removed thyroid  in Aug, 2017, radiation in October    BP 130/78 (BP Location: Left Arm, Patient Position: Sitting)   Pulse (!) 112   Temp 98 F (36.7 C) (Oral)   Resp 16   Ht 5\' 6"  (1.676 m)   Wt 242 lb (109.8 kg)   SpO2 99%   BMI 39.06 kg/m  Gen: awake, alert, appearing stated age Lungs: No accessory muscle use Skin: See below. + Mild TTP.  No drainage, erythema, excessive warmth, fluctuance, excoriation Psych: Age appropriate judgment and insight    Herpes zoster without complication - Plan: valACYclovir (VALTREX) 1000 MG tablet, predniSONE  (DELTASONE ) 20 MG tablet  5 d prednisone  burst 40 mg daily, Valtrex 1 g 2 times daily for 7 days.  Given the itching followed by pain, I do suspect herpes zoster.  If things worsen or change, she will send me a message in 2 days and update me.  Cellulitis on differential. F/u prn. The patient voiced understanding and agreement to the plan.  Shellie Dials [place], DO 09/02/23 7:40 AM

## 2023-11-03 ENCOUNTER — Other Ambulatory Visit: Payer: Self-pay | Admitting: Family Medicine

## 2023-11-03 DIAGNOSIS — F411 Generalized anxiety disorder: Secondary | ICD-10-CM

## 2023-11-04 ENCOUNTER — Other Ambulatory Visit: Payer: Self-pay | Admitting: Family Medicine

## 2023-11-04 ENCOUNTER — Encounter: Payer: Self-pay | Admitting: Gastroenterology

## 2023-11-04 DIAGNOSIS — Z1231 Encounter for screening mammogram for malignant neoplasm of breast: Secondary | ICD-10-CM

## 2023-11-17 ENCOUNTER — Other Ambulatory Visit: Payer: Self-pay | Admitting: Family Medicine

## 2023-11-17 DIAGNOSIS — I10 Essential (primary) hypertension: Secondary | ICD-10-CM

## 2023-12-14 ENCOUNTER — Other Ambulatory Visit: Payer: Self-pay | Admitting: Radiology

## 2023-12-16 ENCOUNTER — Ambulatory Visit

## 2024-01-01 ENCOUNTER — Ambulatory Visit: Admitting: *Deleted

## 2024-01-01 VITALS — Ht 66.0 in | Wt 235.0 lb

## 2024-01-01 DIAGNOSIS — Z1211 Encounter for screening for malignant neoplasm of colon: Secondary | ICD-10-CM

## 2024-01-01 MED ORDER — NA SULFATE-K SULFATE-MG SULF 17.5-3.13-1.6 GM/177ML PO SOLN
1.0000 | Freq: Once | ORAL | 0 refills | Status: AC
Start: 2024-01-01 — End: 2024-01-01

## 2024-01-01 NOTE — Progress Notes (Signed)
 Pt's name and DOB verified at the beginning of the pre-visit with 2 identifiers  Pt denies any difficulty with ambulating,sitting, laying down or rolling side to side  Pt has no issues moving head neck or swallowing  No egg or soy allergy known to patient   No issues known to pt with past sedation  No FH of Malignant Hyperthermia  Pt is not on home 02   Pt is not on blood thinners   Pt denies issues with constipation   Pt is not on dialysis  Pt HX of murmur  Pt denies any upcoming cardiac testing  Patient's chart reviewed by Norleen Schillings CNRA prior to pre-visit and patient appropriate for the [facility].  Pre-visit completed and red dot placed by patient's name on their procedure day (on provider's schedule).    Visit by phone  Pt states weight is 235 lb  Pt given  both [facility] main # and MD on call # prior to instructions.  Informed pt to come in at the time discussed and is shown on PV instructions.  Pt instructed to use Single[employer].com or GoodRx for a price reduction on prep  Instructed pt to review instructions again prior to procedure and call main # given if has any questions or any issues. Pt states they will. Instructed pt where to find PV instructions in My Chart  Instructed pt on all aspects of written instructions including med holds clothing to wear and foods to eat and not eat as well as after procedure legal restrictions and to call MD on call if needed.. Pt states understanding.

## 2024-01-11 ENCOUNTER — Encounter: Admitting: Gastroenterology

## 2024-01-13 ENCOUNTER — Ambulatory Visit

## 2024-01-14 ENCOUNTER — Ambulatory Visit (INDEPENDENT_AMBULATORY_CARE_PROVIDER_SITE_OTHER)

## 2024-01-14 DIAGNOSIS — Z1231 Encounter for screening mammogram for malignant neoplasm of breast: Secondary | ICD-10-CM

## 2024-01-20 ENCOUNTER — Other Ambulatory Visit: Payer: Self-pay | Admitting: Family Medicine

## 2024-02-10 ENCOUNTER — Other Ambulatory Visit: Payer: Self-pay | Admitting: Family Medicine

## 2024-02-10 DIAGNOSIS — E890 Postprocedural hypothyroidism: Secondary | ICD-10-CM

## 2024-02-11 ENCOUNTER — Encounter: Payer: Self-pay | Admitting: Gastroenterology

## 2024-02-22 ENCOUNTER — Encounter: Payer: Self-pay | Admitting: Gastroenterology

## 2024-02-22 ENCOUNTER — Ambulatory Visit (AMBULATORY_SURGERY_CENTER): Admitting: Gastroenterology

## 2024-02-22 VITALS — BP 104/71 | HR 81 | Temp 98.3°F | Resp 19 | Ht 66.0 in | Wt 235.0 lb

## 2024-02-22 DIAGNOSIS — K5730 Diverticulosis of large intestine without perforation or abscess without bleeding: Secondary | ICD-10-CM | POA: Diagnosis not present

## 2024-02-22 DIAGNOSIS — D689 Coagulation defect, unspecified: Secondary | ICD-10-CM | POA: Insufficient documentation

## 2024-02-22 DIAGNOSIS — Z1211 Encounter for screening for malignant neoplasm of colon: Secondary | ICD-10-CM | POA: Diagnosis present

## 2024-02-22 DIAGNOSIS — D122 Benign neoplasm of ascending colon: Secondary | ICD-10-CM | POA: Diagnosis not present

## 2024-02-22 DIAGNOSIS — D123 Benign neoplasm of transverse colon: Secondary | ICD-10-CM

## 2024-02-22 MED ORDER — SODIUM CHLORIDE 0.9 % IV SOLN: 500.0000 mL | INTRAVENOUS | Status: DC

## 2024-02-22 NOTE — Progress Notes (Signed)
 [facility] History and Physical   Primary Care Physician:  Frann Mabel Mt, DO   Reason for Procedure:   Colon cancer screening  Plan:    Screening colonoscopy     HPI: Amy Mccall is a 48 y.o. female undergoing initial average risk screening colonoscopy.  She has no family history of colon cancer and no chronic GI symptoms.    Past Medical History:  Diagnosis Date   Clotting disorder    DVT, pulmonary imbolism 2014   Generalized anxiety disorder    Follows with a counselor   GERD (gastroesophageal reflux disease)    Heart murmur    Hypertension    PE (pulmonary thromboembolism) (HCC) 2014   Started with DVT   Seasonal allergies    Thyroid  cancer (HCC)    Removed thyroid  in Aug, 2017, radiation in October    Past Surgical History:  Procedure Laterality Date   BIOPSY THYROID      CHOLECYSTECTOMY     THYROIDECTOMY  2017    Prior to Admission medications   Medication Sig Start Date End Date Taking? Authorizing Provider  amLODipine  (NORVASC ) 10 MG tablet TAKE 1 TABLET BY MOUTH EVERY DAY 01/20/24  Yes Frann Mabel Mt, DO  aspirin EC 81 MG tablet Take 81 mg by mouth daily.   Yes [provider]  cetirizine (ZYRTEC) 10 MG tablet Take 10 mg by mouth daily as needed for allergies.   Yes [provider]  cholecalciferol (VITAMIN D) 1000 units tablet Take 1,000 Units by mouth daily.   Yes [provider]  esomeprazole (NEXIUM) 20 MG capsule Take 20 mg by mouth daily at 12 noon.   Yes [provider]  fluticasone  (FLONASE ) 50 MCG/ACT nasal spray Place 2 sprays into both nostrils daily. 08/20/20  Yes Frann Mabel Mt, DO  levothyroxine  (SYNTHROID ) 137 MCG tablet Take 1 tablet (137 mcg total) by mouth daily before breakfast. 02/10/24  Yes Wendling, Mabel Mt, DO  norethindrone  (MICRONOR ) 0.35 MG tablet Take 1 tablet (0.35 mg total) by mouth daily. 03/02/23  Yes Cris Burnard DEL, MD  olmesartan  (BENICAR ) 20 MG  tablet Take 1 tablet (20 mg total) by mouth daily. 11/17/23  Yes Frann Mabel Mt, DO  venlafaxine  XR (EFFEXOR -XR) 150 MG 24 hr capsule Take 1 capsule (150 mg total) by mouth daily with breakfast. 11/03/23  Yes Wendling, Mabel Mt, DO    Current Outpatient Medications  Medication Sig Dispense Refill   amLODipine  (NORVASC ) 10 MG tablet TAKE 1 TABLET BY MOUTH EVERY DAY 90 tablet 1   aspirin EC 81 MG tablet Take 81 mg by mouth daily.     cetirizine (ZYRTEC) 10 MG tablet Take 10 mg by mouth daily as needed for allergies.     cholecalciferol (VITAMIN D) 1000 units tablet Take 1,000 Units by mouth daily.     esomeprazole (NEXIUM) 20 MG capsule Take 20 mg by mouth daily at 12 noon.     fluticasone  (FLONASE ) 50 MCG/ACT nasal spray Place 2 sprays into both nostrils daily. 16 g 2   levothyroxine  (SYNTHROID ) 137 MCG tablet Take 1 tablet (137 mcg total) by mouth daily before breakfast. 90 tablet 0   norethindrone  (MICRONOR ) 0.35 MG tablet Take 1 tablet (0.35 mg total) by mouth daily. 28 tablet 13   olmesartan  (BENICAR ) 20 MG tablet Take 1 tablet (20 mg total) by mouth daily. 90 tablet 1   venlafaxine  XR (EFFEXOR -XR) 150 MG 24 hr capsule Take 1 capsule (150 mg total) by mouth daily with  breakfast. 90 capsule 0   Current Facility-Administered Medications  Medication Dose Route Frequency Provider Last Rate Last Admin   0.9 %  sodium chloride infusion  500 mL Intravenous Continuous Stacia Glendia BRAVO, MD        Allergies as of 02/22/2024 - Review Complete 02/22/2024  Allergen Reaction Noted   Sulfa antibiotics Rash 11/05/2012   Chlorhexidine Hives 01/01/2024   Doxycycline  Rash 04/03/2022   Penicillins Rash 11/05/2012    Family History  Problem Relation Age of Onset   Osteoporosis Mother    Breast cancer Mother        BRCA neg   COPD Father    Diabetes Father    Heart disease Father    Colon polyps Cousin    Colon cancer Neg Hx    Esophageal cancer Neg Hx    Stomach cancer Neg Hx     Rectal cancer Neg Hx     Social History   Socioeconomic History   Marital status: Married    Spouse name: Not on file   Number of children: Not on file   Years of education: Not on file   Highest education level: Bachelor's degree (e.g., BA, AB, BS)  Occupational History   Not on file  Tobacco Use   Smoking status: Never   Smokeless tobacco: Never  Vaping Use   Vaping status: Never Used  Substance and Sexual Activity   Alcohol use: No   Drug use: No   Sexual activity: Yes    Birth control/protection: Pill    Comment: vassectomy  Other Topics Concern   Not on file  Social History Narrative   Not on file   Social Drivers of Health   Financial Resource Strain: Low Risk  (06/26/2023)   Overall Financial Resource Strain (CARDIA)    Difficulty of Paying Living Expenses: Not very hard  Food Insecurity: No Food Insecurity (06/26/2023)   Hunger Vital Sign    Worried About Running Out of Food in the Last Year: Never true    Ran Out of Food in the Last Year: Never true  Transportation Needs: No Transportation Needs (06/26/2023)   PRAPARE - Administrator, Civil Service (Medical): No    Lack of Transportation (Non-Medical): No  Physical Activity: Insufficiently Active (06/26/2023)   Exercise Vital Sign    Days of Exercise per Week: 3 days    Minutes of Exercise per Session: 20 min  Stress: No Stress Concern Present (06/26/2023)   [employer] of Occupational Health - Occupational Stress Questionnaire    Feeling of Stress : Only a little  Social Connections: Moderately Isolated (06/26/2023)   Social Connection and Isolation Panel    Frequency of Communication with Friends and Family: More than three times a week    Frequency of Social Gatherings with Friends and Family: Once a week    Attends Religious Services: Never    Database administrator or Organizations: No    Attends Engineer, structural: Not on file    Marital Status: Married  Catering manager  Violence: Not on file    Review of Systems:  All other review of systems negative except as mentioned in the HPI.  Physical Exam: Vital signs BP 121/79   Pulse (!) 108   Temp 98.3 F (36.8 C) (Temporal)   Ht 5' 6 (1.676 m)   Wt 235 lb (106.6 kg)   SpO2 96%   BMI 37.93 kg/m   General:   Alert,  Well-developed,  well-nourished, pleasant and cooperative in NAD Airway:  Mallampati 3 Lungs:  Clear throughout to auscultation.   Heart:  Regular rate and rhythm; no murmurs, clicks, rubs,  or gallops. Abdomen:  Soft, nontender and nondistended. Normal bowel sounds.   Neuro/Psych:  Normal mood and affect. A and O x 3   Berit Raczkowski E. Stacia, MD [facility]

## 2024-02-22 NOTE — Progress Notes (Signed)
 To pacu, VSS. Report to [email or web address]

## 2024-02-22 NOTE — Progress Notes (Signed)
 Called to room to assist during endoscopic procedure.  Patient ID and intended procedure confirmed with present staff. Received instructions for my participation in the procedure from the performing physician.

## 2024-02-22 NOTE — Patient Instructions (Addendum)
 Resume previous diet  Continue present medications  Await pathology results  Repeat colonoscopy in 6-9 months for surveillance.  See handouts on diverticulosis and polyps YOU HAD AN ENDOSCOPIC PROCEDURE TODAY AT THE [facility]:   Refer to the procedure report that was given to you for any specific questions about what was found during the examination.  If the procedure report does not answer your questions, please call your gastroenterologist to clarify.  If you requested that your care partner not be given the details of your procedure findings, then the procedure report has been included in a sealed envelope for you to review at your convenience later.  YOU SHOULD EXPECT: Some feelings of bloating in the abdomen. Passage of more gas than usual.  Walking can help get rid of the air that was put into your GI tract during the procedure and reduce the bloating. If you had a lower endoscopy (such as a colonoscopy or flexible sigmoidoscopy) you may notice spotting of blood in your stool or on the toilet paper. If you underwent a bowel prep for your procedure, you may not have a normal bowel movement for a few days.  Please Note:  You might notice some irritation and congestion in your nose or some drainage.  This is from the oxygen used during your procedure.  There is no need for concern and it should clear up in a day or so.  SYMPTOMS TO REPORT IMMEDIATELY: Following lower endoscopy (colonoscopy or flexible sigmoidoscopy):  Excessive amounts of blood in the stool  Significant tenderness or worsening of abdominal pains  Swelling of the abdomen that is new, acute  Fever of 100F or higher  For urgent or emergent issues, a gastroenterologist can be reached at any hour by calling [phone]. Do not use MyChart messaging for urgent concerns.   DIET:  We do recommend a small meal at first, but then you may proceed to your regular diet.  Drink plenty of fluids but you should avoid  alcoholic beverages for 24 hours.  ACTIVITY:  You should plan to take it easy for the rest of today and you should NOT DRIVE or use heavy machinery until tomorrow (because of the sedation medicines used during the test).    FOLLOW UP: Our staff will call the number listed on your records the next business day following your procedure.  We will call around 7:15- 8:00 am to check on you and address any questions or concerns that you may have regarding the information given to you following your procedure. If we do not reach you, we will leave a message.     If any biopsies were taken you will be contacted by phone or by letter within the next 1-3 weeks.  Please call us  at [phone] if you have not heard about the biopsies in 3 weeks.   SIGNATURES/CONFIDENTIALITY: You and/or your care partner have signed paperwork which will be entered into your electronic medical record.  These signatures attest to the fact that that the information above on your After Visit Summary has been reviewed and is understood.  Full responsibility of the confidentiality of this discharge information lies with you and/or your care-partner.

## 2024-02-22 NOTE — Op Note (Signed)
 [facility] Patient Name: Amy Mccall Procedure Date: 02/22/2024 7:18 AM MRN: [id number] Endoscopist: Glendia E. Stacia , MD, [id number] Age: 48 Referring MD:  Date of Birth: Mar 06, 1976 Gender: Female Account #: [id number] Procedure:                Colonoscopy Indications:              Screening for colorectal malignant neoplasm, This                            is the patient's first colonoscopy Medicines:                Monitored Anesthesia Care Procedure:                Pre-Anesthesia Assessment:                           - Prior to the procedure, a History and Physical                            was performed, and patient medications and                            allergies were reviewed. The patient's tolerance of                            previous anesthesia was also reviewed. The risks                            and benefits of the procedure and the sedation                            options and risks were discussed with the patient.                            All questions were answered, and informed consent                            was obtained. Prior Anticoagulants: The patient has                            taken no anticoagulant or antiplatelet agents                            except for aspirin. ASA Grade Assessment: III - A                            patient with severe systemic disease. After                            reviewing the risks and benefits, the patient was                            deemed in satisfactory condition to undergo the  procedure.                           After obtaining informed consent, the colonoscope                            was passed under direct vision. Throughout the                            procedure, the patient's blood pressure, pulse, and                            oxygen saturations were monitored continuously. The                            Olympus Scope SN: [id number] was introduced through                             the anus and advanced to the the cecum, identified                            by appendiceal orifice and ileocecal valve. The                            colonoscopy was performed without difficulty. The                            patient tolerated the procedure well. The quality                            of the bowel preparation was adequate. The                            ileocecal valve, appendiceal orifice, and rectum                            were photographed. The bowel preparation used was                            SUPREP via split dose instruction. Scope In: 8:01:43 AM Scope Out: 8:50:20 AM Scope Withdrawal Time: 0 hours 42 minutes 48 seconds  Total Procedure Duration: 0 hours 48 minutes 37 seconds  Findings:                 The perianal and digital rectal examinations were                            normal. Pertinent negatives include normal                            sphincter tone and no palpable rectal lesions.                           A 2 mm polyp was found in the ascending colon. The  polyp was sessile. The polyp was removed with a                            cold snare. Resection and retrieval were complete.                            Estimated blood loss was minimal.                           Three sessile and semi-pedunculated polyps were                            found in the transverse colon. The polyps were 8 to                            11 mm in size. These polyps were removed with a                            cold snare. Resection and retrieval were complete.                            Estimated blood loss was minimal.                           A 26 mm polyp was found in the mid transverse                            colon. The polyp was flat. The polyp was injected                            with 14 mL of saline to lift the lesion. The lesion                            lifted well and uniformly. The polyp was  then                            removed with a piecemeal technique using a                            combination of hot snare and cold snare. Snare tip                            cautery and cold forceps were used to treat                            possible residual polypoid tissue at the border of                            the resection site. Resection and retrieval were                            complete. To prevent bleeding after mucosal  resection, three hemostatic clips were successfully                            placed (MR conditional). Clip manufacturer:                            [employer]. There was no bleeding at the end of the                            procedure. The mucosa was tattooed with an                            injection of 2 mL of Spot (carbon black) just                            distal to the polypectomy site. Estimated blood                            loss was minimal.                           Many large-mouthed and small-mouthed diverticula                            were found in the sigmoid colon and descending                            colon.                           The exam was otherwise normal throughout the                            examined colon.                           The retroflexed view of the distal rectum and anal                            verge was normal and showed no anal or rectal                            abnormalities. Complications:            No immediate complications. Estimated Blood Loss:     Estimated blood loss was minimal. Impression:               - One 2 mm polyp in the ascending colon, removed                            with a cold snare. Resected and retrieved.                           - Three 8 to 11 mm polyps in the transverse colon,  removed with a cold snare. Resected and retrieved.                           - One 26 mm polyp in the mid transverse colon,                             removed piecemeal using a hot snare. Resected and                            retrieved. Clips (MR conditional) were placed.                            Tattooed.                           - Severe diverticulosis in the sigmoid colon and in                            the descending colon.                           - The distal rectum and anal verge are normal on                            retroflexion view. Recommendation:           - Patient has a contact number available for                            emergencies. The signs and symptoms of potential                            delayed complications were discussed with the                            patient. Return to normal activities tomorrow.                            Written discharge instructions were provided to the                            patient.                           - Resume previous diet.                           - Repeat colonoscopy in 6-9 months for surveillance                            after piecemeal polypectomy. Haskel Dewalt E. Stacia, MD 02/22/2024 9:02:24 AM This report has been signed electronically.

## 2024-02-23 ENCOUNTER — Telehealth: Payer: Self-pay

## 2024-02-23 NOTE — Telephone Encounter (Signed)
 No answer on follow-up call. Left VM for pt.

## 2024-02-24 LAB — SURGICAL PATHOLOGY

## 2024-02-25 ENCOUNTER — Other Ambulatory Visit: Payer: Self-pay | Admitting: Family Medicine

## 2024-02-25 DIAGNOSIS — I10 Essential (primary) hypertension: Secondary | ICD-10-CM

## 2024-02-26 ENCOUNTER — Ambulatory Visit: Payer: Self-pay | Admitting: Gastroenterology

## 2024-02-26 NOTE — Progress Notes (Signed)
 Amy Mccall,  The polyps that I removed during your recent procedure were completely benign but were proven to be pre-cancerous polyps that MAY have grown into cancers if they had not been removed.  Studies shows that at least 20% of women over age 48 and 30% of men over age 35 have pre-cancerous polyps.  Based on current nationally recognized surveillance guidelines, I recommend that you have a repeat colonoscopy in 6-9 months due to piecemeal resection of large flat polyp.   If you develop any new rectal bleeding, abdominal pain or significant bowel habit changes, please contact me before then.

## 2024-03-11 ENCOUNTER — Other Ambulatory Visit: Payer: Self-pay | Admitting: Obstetrics & Gynecology

## 2024-03-21 ENCOUNTER — Encounter: Payer: 59 | Admitting: Family Medicine

## 2024-03-21 ENCOUNTER — Ambulatory Visit: Admitting: Obstetrics & Gynecology

## 2024-03-21 ENCOUNTER — Other Ambulatory Visit (HOSPITAL_COMMUNITY)
Admission: RE | Admit: 2024-03-21 | Discharge: 2024-03-21 | Disposition: A | Discharge status: Home / Self Care | Source: Ambulatory Visit | Attending: Obstetrics & Gynecology | Admitting: Obstetrics & Gynecology

## 2024-03-21 ENCOUNTER — Encounter: Payer: Self-pay | Admitting: Obstetrics & Gynecology

## 2024-03-21 VITALS — BP 129/83 | HR 101 | Ht 67.0 in | Wt 247.0 lb

## 2024-03-21 DIAGNOSIS — Z01419 Encounter for gynecological examination (general) (routine) without abnormal findings: Secondary | ICD-10-CM

## 2024-03-21 DIAGNOSIS — N841 Polyp of cervix uteri: Secondary | ICD-10-CM | POA: Diagnosis present

## 2024-03-21 NOTE — Progress Notes (Signed)
  Subjective:     Amy Mccall is a 48 y.o. female here for a routine exam.  Current complaints: mom diagnosed with multiple myeloma.     Gynecologic History No LMP recorded. (Menstrual status: Oral contraceptives). Contraception: oral progesterone-only contraceptive Last pap smear (date and result):2023 Last mammogram (date and result):01/19/24 Last colon screening (date and result):02/22/24 Brush:Yes  Floss:Yes, sometimes Seatbelts: yes Sunscreen: yes  Obstetric History OB History  Gravida Para Term Preterm AB Living  1 1    1   SAB IAB Ectopic Multiple Live Births      1    # Outcome Date GA Lbr Len/2nd Weight Sex Type Anes PTL Lv  1 Para              The following portions of the patient's history were reviewed and updated as appropriate: allergies, current medications, past family history, past medical history, past social history, past surgical history, and problem list.  Review of Systems Pertinent items noted in HPI and remainder of comprehensive ROS otherwise negative.    Objective:     Vitals:   03/21/24 1503 03/21/24 1510  BP: (!) 137/90 129/83  Pulse: (!) 106 (!) 101  Weight: 247 lb (112 kg)   Height: 5' 7 (1.702 m)    Vitals:  WNL General appearance: alert, cooperative and no distress  HEENT: Normocephalic, without obvious abnormality, atraumatic Eyes: negative Throat: lips, mucosa, and tongue normal; teeth and gums normal  Respiratory: Clear to auscultation bilaterally  CV: Regular rate and rhythm  Breasts:  Normal appearance, no masses or tenderness, no nipple retraction or dimpling  GI: Soft, non-tender; bowel sounds normal; no masses,  no organomegaly  GU: External Genitalia:  Tanner V, no lesion Urethra:  No prolapse   Vagina: Pink, normal rugae, no blood or discharge  Cervix: No CMT, Cervix is large and irregular --possible form tears during childbirth.   Biopsies taken   Uterus:  Normal size and contour, non tender  Adnexa: Normal, no masses,  non tender  Musculoskeletal: No edema, redness or tenderness in the calves or thighs  Skin: No lesions or rash  Lymphatic: Axillary adenopathy: none     Psychiatric: Normal mood and behavior        Assessment:    Healthy female exam.    Plan:    Pap up to date.  Cervical biopsy taken today -- contour of cervix looked and felt irregular.  Mammogram up to date Colonoscopy up to date. Continue POPs  CERVICAL BIOPSY NOTE  The indications for cervical biopsy were reviewed.   Risks of the biopsy including pain, bleeding, infection, inadequate specimen, scarring and need for additional procedures  were discussed. The patient stated understanding and agreed to undergo procedure today. Consent was signed,  time out performed. A Tischler biopsy forceps.  A small bleeding was noted and hemostasis was achieved using silver nitrate sticks.  The patient tolerated the procedure well. Post-procedure instructions  (pelvic rest for one week) were given to the patient. The patient is to call with heavy bleeding, fever greater than 100.4, foul smelling vaginal discharge or other concerns. Will communicate next steps via my chart.

## 2024-03-23 LAB — SURGICAL PATHOLOGY

## 2024-03-27 ENCOUNTER — Ambulatory Visit: Payer: Self-pay | Admitting: Obstetrics & Gynecology

## 2024-03-28 LAB — CYTOLOGY - PAP
Adequacy: ABNORMAL
Comment: NEGATIVE

## 2024-04-04 ENCOUNTER — Encounter: Payer: Self-pay | Admitting: Family Medicine

## 2024-04-04 ENCOUNTER — Ambulatory Visit: Admitting: Family Medicine

## 2024-04-04 VITALS — BP 130/82 | HR 100 | Temp 98.0°F | Resp 16 | Ht 67.0 in | Wt 246.8 lb

## 2024-04-04 DIAGNOSIS — Z23 Encounter for immunization: Secondary | ICD-10-CM | POA: Diagnosis not present

## 2024-04-04 DIAGNOSIS — I10 Essential (primary) hypertension: Secondary | ICD-10-CM | POA: Diagnosis not present

## 2024-04-04 DIAGNOSIS — Z0000 Encounter for general adult medical examination without abnormal findings: Secondary | ICD-10-CM

## 2024-04-04 DIAGNOSIS — L989 Disorder of the skin and subcutaneous tissue, unspecified: Secondary | ICD-10-CM

## 2024-04-04 DIAGNOSIS — E890 Postprocedural hypothyroidism: Secondary | ICD-10-CM | POA: Diagnosis not present

## 2024-04-04 DIAGNOSIS — E669 Obesity, unspecified: Secondary | ICD-10-CM | POA: Diagnosis not present

## 2024-04-04 DIAGNOSIS — F411 Generalized anxiety disorder: Secondary | ICD-10-CM

## 2024-04-04 MED ORDER — LEVOTHYROXINE SODIUM 137 MCG PO TABS: 137.0000 ug | ORAL_TABLET | Freq: Every day | ORAL | 3 refills | Status: AC

## 2024-04-04 MED ORDER — VENLAFAXINE HCL ER 150 MG PO CP24: 150.0000 mg | ORAL_CAPSULE | Freq: Every day | ORAL | 3 refills | Status: AC

## 2024-04-04 NOTE — Patient Instructions (Signed)
Give us 2-3 business days to get the results of your labs back.   Keep the diet clean and stay active.  If you do not hear anything about your referral in the next 1-2 weeks, call our office and ask for an update.  Please get me a copy of your advanced directive form at your convenience.   Let us know if you need anything. 

## 2024-04-04 NOTE — Progress Notes (Signed)
 Chief Complaint  Patient presents with   Annual Exam    CPE     Well Woman Amy Mccall is here for a complete physical.   Her last physical was >1 year ago.  Current diet: in general, diet could be better. . Current exercise: none. Weight is increased and she denies fatigue out of ordinary. Seatbelt? Yes Advanced directive? No  Health Maintenance Pap/HPV- Yes Mammogram- Yes Tetanus- Yes Hep C screening- Yes HIV screening- Yes  Hypertension Patient presents for hypertension follow up. She does not monitor home blood pressures. She is compliant with medications-olmesartan  20 mg daily, amlodipine  10 mg daily. Patient has these side effects of medication: none Diet/exercise as above.  Hypothyroidism Patient presents for follow-up of hypothyroidism.  Reports compliance with medication-levothyroxine  137 mcg daily. Current symptoms include: weight gain Denies: denies fatigue, unexplained weight changes, heat/cold intolerance, bowel/skin changes or CVS symptoms She believes her dose should be not significantly changed  Anxiety Currently on venlafaxine  XR 150 mg daily.  Compliant, no adverse effects.  She is seeing a therapist.  No homicidal/suicidal ideation.  No self-medication.  Symptoms are well-controlled overall.  She does not wish to make a dosage change.  Past Medical History:  Diagnosis Date   Clotting disorder    DVT, pulmonary imbolism 2014   Generalized anxiety disorder    Follows with a counselor   GERD (gastroesophageal reflux disease)    Heart murmur    Hypertension    PE (pulmonary thromboembolism) (HCC) 2014   Started with DVT   Seasonal allergies    Thyroid  cancer (HCC)    Removed thyroid  in Aug, 2017, radiation in October     Past Surgical History:  Procedure Laterality Date   BIOPSY THYROID      CHOLECYSTECTOMY     THYROIDECTOMY  2017    Medications  Current Outpatient Medications on File Prior to Visit  Medication Sig Dispense Refill    amLODipine  (NORVASC ) 10 MG tablet TAKE 1 TABLET BY MOUTH EVERY DAY 90 tablet 1   aspirin EC 81 MG tablet Take 81 mg by mouth daily.     cetirizine (ZYRTEC) 10 MG tablet Take 10 mg by mouth daily as needed for allergies.     cholecalciferol (VITAMIN D) 1000 units tablet Take 1,000 Units by mouth daily.     esomeprazole (NEXIUM) 20 MG capsule Take 20 mg by mouth daily at 12 noon.     fluticasone  (FLONASE ) 50 MCG/ACT nasal spray Place 2 sprays into both nostrils daily. 16 g 2   norethindrone  (MICRONOR ) 0.35 MG tablet TAKE 1 TABLET BY MOUTH EVERY DAY 90 tablet 0   olmesartan  (BENICAR ) 20 MG tablet Take 1 tablet (20 mg total) by mouth daily. 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   Chlorhexidine Hives   Doxycycline  Rash   Penicillins Rash    Childhood allergy Childhood allergy Childhood allergy    Review of Systems: Constitutional:  no unexpected weight changes Eye:  no recent significant change in vision Ear/Nose/Mouth/Throat:  Ears:  no recent change in hearing Nose/Mouth/Throat:  no complaints of nasal congestion, no sore throat Cardiovascular: no chest pain Respiratory:  no shortness of breath Gastrointestinal:  no abdominal pain, no change in bowel habits GU:  Female: negative for dysuria or pelvic pain Musculoskeletal/Extremities:  no pain of the joints Integumentary (Skin/Breast):  no abnormal skin lesions reported Neurologic:  no headaches Endocrine:  denies fatigue Hematologic/Lymphatic:  No areas of easy bleeding  Exam BP 130/82 (BP Location: Left Arm, Patient Position: Sitting)   Pulse 100   Temp 98 F (36.7 C) (Oral)   Resp 16   Ht 5' 7 (1.702 m)   Wt 246 lb 12.8 oz (111.9 kg)   SpO2 95%   BMI 38.65 kg/m  General:  well developed, well nourished, in no apparent distress Skin:  no significant moles, warts, or growths Head:  no masses, lesions, or tenderness Eyes:  pupils equal and round,  sclera anicteric without injection Ears:  canals without lesions, TMs shiny without retraction, no obvious effusion, no erythema Nose:  nares patent, mucosa normal, and no drainage Throat/Pharynx:  lips and gingiva without lesion; tongue and uvula midline; non-inflamed pharynx; no exudates or postnasal drainage Neck: neck supple without adenopathy, thyromegaly, or masses Lungs:  clear to auscultation, breath sounds equal bilaterally, no respiratory distress Cardio:  regular rate and rhythm, no LE edema Abdomen:  abdomen soft, nontender; bowel sounds normal; no masses or organomegaly Genital: Defer to GYN Musculoskeletal:  symmetrical muscle groups noted without atrophy or deformity Extremities:  no clubbing, cyanosis, or edema, no deformities, no skin discoloration Neuro:  gait normal; deep tendon reflexes normal and symmetric Psych: well oriented with normal range of affect and appropriate judgment/insight  Assessment and Plan  Well adult exam - Plan: CBC, Comprehensive metabolic panel with GFR, Lipid panel  GAD (generalized anxiety disorder) - Plan: venlafaxine  XR (EFFEXOR -XR) 150 MG 24 hr capsule  Essential hypertension  Post-operative hypothyroidism - Plan: levothyroxine  (SYNTHROID ) 137 MCG tablet, TSH, T4, free, Thyroglobulin antibody  Obesity (BMI 30-39.9) - Plan: Amb Ref to Medical Weight Management  Skin lesion - Plan: Ambulatory referral to Dermatology  Need for influenza vaccination - Plan: Flu vaccine trivalent PF, 6mos and older(Flulaval,Afluria,Fluarix,Fluzone)   [name]. Counseled on diet and exercise. Flu shot today.  Advanced directive form provided today. HTN: Chronic, stable. Cont Norvasc  10 mg daily, olmesartan  20 mg daily. Obesity: Referred to the medical weight loss team. Hypothyroidism: Chronic, stable.  Continue levothyroxine  137 mcg daily.  Check above labs. Anxiety: Chronic, stable.  Continue Effexor  XR 150 mg daily.  Continue with the  counseling team. She received Hep B vaccination before.  Other orders as above. Follow up in 1 yr. The patient voiced understanding and agreement to the plan.  Amy [place], DO 04/04/24 4:24 PM

## 2024-04-05 ENCOUNTER — Ambulatory Visit: Payer: Self-pay | Admitting: Family Medicine

## 2024-04-05 LAB — CBC
HCT: 36.9 % (ref 36.0–46.0)
Hemoglobin: 12.2 g/dL (ref 12.0–15.0)
MCHC: 33 g/dL (ref 30.0–36.0)
MCV: 82 fl (ref 78.0–100.0)
Platelets: 402 K/uL — ABNORMAL HIGH (ref 150.0–400.0)
RBC: 4.5 Mil/uL (ref 3.87–5.11)
RDW: 15.5 % (ref 11.5–15.5)
WBC: 11.2 K/uL — ABNORMAL HIGH (ref 4.0–10.5)

## 2024-04-05 LAB — COMPREHENSIVE METABOLIC PANEL WITH GFR
ALT: 26 U/L (ref 0–35)
AST: 20 U/L (ref 0–37)
Albumin: 4.5 g/dL (ref 3.5–5.2)
Alkaline Phosphatase: 113 U/L (ref 39–117)
BUN: 13 mg/dL (ref 6–23)
CO2: 26 meq/L (ref 19–32)
Calcium: 9.8 mg/dL (ref 8.4–10.5)
Chloride: 102 meq/L (ref 96–112)
Creatinine, Ser: 0.92 mg/dL (ref 0.40–1.20)
GFR: 73.9 mL/min (ref >60.00)
Glucose, Bld: 102 mg/dL — ABNORMAL HIGH (ref 70–99)
Potassium: 3.9 meq/L (ref 3.5–5.1)
Sodium: 137 meq/L (ref 135–145)
Total Bilirubin: 0.8 mg/dL (ref 0.2–1.2)
Total Protein: 7.9 g/dL (ref 6.0–8.3)

## 2024-04-05 LAB — TSH: TSH: 0.14 u[IU]/mL — ABNORMAL LOW (ref 0.35–5.50)

## 2024-04-05 LAB — LIPID PANEL
Cholesterol: 215 mg/dL — ABNORMAL HIGH (ref 0–200)
HDL: 60.9 mg/dL (ref >39.00)
LDL Cholesterol: 136 mg/dL — ABNORMAL HIGH (ref 0–99)
NonHDL: 154.41
Total CHOL/HDL Ratio: 4
Triglycerides: 93 mg/dL (ref 0.0–149.0)
VLDL: 18.6 mg/dL (ref 0.0–40.0)

## 2024-04-05 LAB — T4, FREE: Free T4: 0.82 ng/dL (ref 0.60–1.60)

## 2024-04-06 ENCOUNTER — Other Ambulatory Visit: Payer: Self-pay

## 2024-04-06 ENCOUNTER — Ambulatory Visit: Payer: Self-pay | Admitting: Family Medicine

## 2024-04-06 ENCOUNTER — Ambulatory Visit (INDEPENDENT_AMBULATORY_CARE_PROVIDER_SITE_OTHER)

## 2024-04-06 ENCOUNTER — Ambulatory Visit

## 2024-04-06 ENCOUNTER — Telehealth: Payer: Self-pay | Admitting: *Deleted

## 2024-04-06 DIAGNOSIS — D729 Disorder of white blood cells, unspecified: Secondary | ICD-10-CM

## 2024-04-06 LAB — THYROGLOBULIN ANTIBODY: Thyroglobulin Ab: 1 [IU]/mL (ref <=1)

## 2024-04-06 LAB — HEMOGLOBIN A1C: Hgb A1c MFr Bld: 6 % (ref 4.6–6.5)

## 2024-04-06 MED ORDER — NORETHINDRONE 0.35 MG PO TABS: 1.0000 | ORAL_TABLET | Freq: Every day | ORAL | 3 refills | Status: AC

## 2024-04-06 NOTE — Telephone Encounter (Signed)
 Left patient a message to call and schedule repeat pap with Dr. Cris.

## 2024-05-02 ENCOUNTER — Encounter (HOSPITAL_BASED_OUTPATIENT_CLINIC_OR_DEPARTMENT_OTHER): Payer: Self-pay

## 2024-05-02 ENCOUNTER — Other Ambulatory Visit: Payer: Self-pay

## 2024-05-02 ENCOUNTER — Emergency Department (HOSPITAL_BASED_OUTPATIENT_CLINIC_OR_DEPARTMENT_OTHER)

## 2024-05-02 ENCOUNTER — Ambulatory Visit: Admitting: Obstetrics & Gynecology

## 2024-05-02 ENCOUNTER — Emergency Department (HOSPITAL_BASED_OUTPATIENT_CLINIC_OR_DEPARTMENT_OTHER)
Admission: EM | Admit: 2024-05-02 | Discharge: 2024-05-02 | Disposition: A | Discharge status: Home / Self Care | Attending: Emergency Medicine | Admitting: Emergency Medicine

## 2024-05-02 DIAGNOSIS — Z7982 Long term (current) use of aspirin: Secondary | ICD-10-CM | POA: Diagnosis not present

## 2024-05-02 DIAGNOSIS — Z85850 Personal history of malignant neoplasm of thyroid: Secondary | ICD-10-CM | POA: Diagnosis not present

## 2024-05-02 DIAGNOSIS — M25561 Pain in right knee: Secondary | ICD-10-CM | POA: Diagnosis present

## 2024-05-02 DIAGNOSIS — W010XXA Fall on same level from slipping, tripping and stumbling without subsequent striking against object, initial encounter: Secondary | ICD-10-CM | POA: Insufficient documentation

## 2024-05-02 DIAGNOSIS — W19XXXA Unspecified fall, initial encounter: Secondary | ICD-10-CM

## 2024-05-02 DIAGNOSIS — M25461 Effusion, right knee: Secondary | ICD-10-CM | POA: Diagnosis not present

## 2024-05-02 NOTE — ED Triage Notes (Signed)
 Reports from bed, fell on R shin. Swelling and bruising noted to top of R shin.

## 2024-05-02 NOTE — ED Provider Notes (Signed)
 " [place] [facility] Provider Note   CSN: [id number] Arrival date & time: 05/02/24  9067     Patient presents with: Leg Injury   Amy Mccall is a 48 y.o. female.  HPI Patient is a 48 year old female presenting ED today for concerns for right knee pain after mechanical fall noting that she was getting up from sitting from the couch when she spilled her drink, her foot got caught up in her blanket and she tripped and landed on her right knee.  Reports that she did not hit her head, did not lose consciousness, not on blood thinners.  Does notice some right knee swelling since the incident but has been able to ambulate though limited secondary to pain at the knee.  Additionally noting that she has not felt unstable since she injured her knee.  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  Denies numbness, weakness, tingling.     Prior to Admission medications  Medication Sig Start Date End Date Taking? Authorizing Provider  amLODipine  (NORVASC ) 10 MG tablet TAKE 1 TABLET BY MOUTH EVERY DAY 01/20/24   Frann Mabel Mt, DO  aspirin EC 81 MG tablet Take 81 mg by mouth daily.    [provider]  cetirizine (ZYRTEC) 10 MG tablet Take 10 mg by mouth daily as needed for allergies.    [provider]  cholecalciferol (VITAMIN D) 1000 units tablet Take 1,000 Units by mouth daily.    [provider]  esomeprazole (NEXIUM) 20 MG capsule Take 20 mg by mouth daily at 12 noon.    [provider]  fluticasone  (FLONASE ) 50 MCG/ACT nasal spray Place 2 sprays into both nostrils daily. 08/20/20   Frann Mabel Mt, DO  levothyroxine  (SYNTHROID ) 137 MCG tablet Take 1 tablet (137 mcg total) by mouth daily before breakfast. 04/04/24   Wendling, Mabel Mt, DO  norethindrone  (MICRONOR ) 0.35 MG tablet Take 1 tablet (0.35 mg total) by mouth daily. 04/06/24   Cris Burnard DEL, MD  olmesartan  (BENICAR ) 20 MG tablet Take  1 tablet (20 mg total) by mouth daily. 02/25/24   Frann Mabel Mt, DO  venlafaxine  XR (EFFEXOR -XR) 150 MG 24 hr capsule Take 1 capsule (150 mg total) by mouth daily with breakfast. 04/04/24   Frann, Mabel Mt, DO    Allergies: Sulfa antibiotics, Chlorhexidine, Doxycycline , and Penicillins    Review of Systems  Musculoskeletal:  Positive for arthralgias and joint swelling.  All other systems reviewed and are negative.   Updated Vital Signs BP (!) 125/98   Pulse (!) 107   Temp 98.2 F (36.8 C)   Resp 20   SpO2 99%   Physical Exam Vitals and nursing note reviewed.  Constitutional:      General: She is not in acute distress.    Appearance: Normal appearance. She is not ill-appearing or diaphoretic.  HENT:     Head: Normocephalic and atraumatic.  Eyes:     General: No scleral icterus.       Right eye: No discharge.        Left eye: No discharge.     Extraocular Movements: Extraocular movements intact.     Conjunctiva/sclera: Conjunctivae normal.  Cardiovascular:     Rate and Rhythm: Normal rate and regular rhythm.     Pulses: Normal pulses.     Heart sounds: Normal heart sounds. No murmur heard.    No friction rub. No gallop.  Pulmonary:     Effort: Pulmonary  effort is normal. No respiratory distress.     Breath sounds: No stridor. No wheezing, rhonchi or rales.  Chest:     Chest wall: No tenderness.  Abdominal:     General: Abdomen is flat. There is no distension.     Palpations: Abdomen is soft.     Tenderness: There is no abdominal tenderness. There is no right CVA tenderness, left CVA tenderness, guarding or rebound.  Musculoskeletal:        General: Swelling and tenderness present. No deformity.     Cervical back: Normal range of motion. No rigidity.     Right lower leg: No edema.     Left lower leg: No edema.     Comments: Notable does have some pretibial right knee swelling present, that is tender to palpation.  Small superficial abrasion noted on  knee.  Patient does have full range of motion at knee.  DP pulse 2+, sensation intact.  No other injuries noted to upper or lower extremities.  Skin:    General: Skin is warm and dry.     Findings: No bruising, erythema or lesion.  Neurological:     General: No focal deficit present.     Mental Status: She is alert and oriented to person, place, and time. Mental status is at baseline.     Sensory: No sensory deficit.     Motor: No weakness.  Psychiatric:        Mood and Affect: Mood normal.     (all labs ordered are listed, but only abnormal results are displayed) Labs Reviewed - No data to display  EKG: None  Radiology: DG Knee Complete 4 Views Right Result Date: 05/02/2024 EXAM: 4 OR MORE VIEW(S) XRAY OF THE KNEE 05/02/2024 10:35:00 AM COMPARISON: None available. CLINICAL HISTORY: leg injury FINDINGS: BONES AND JOINTS: No acute fracture. No malalignment. No significant joint effusion. SOFT TISSUES: The soft tissues are unremarkable. IMPRESSION: 1. No acute fracture or dislocation. Electronically signed by: Morgane Naveau MD 05/02/2024 12:36 PM EST RP Workstation: [id number]   DG Tibia/Fibula Right Result Date: 05/02/2024 EXAM: 1 VIEW(S) XRAY OF THE RIGHT TIBIA AND FIBULA 05/02/2024 10:35:00 AM COMPARISON: X-RAY RIGHT KNEE 05/02/2024. CLINICAL HISTORY: leg injury FINDINGS: BONES AND JOINTS: No acute fracture. No malalignment. The ankle is grossly unremarkable. SOFT TISSUES: Proximal pretibial soft tissue edema. Dermal calcification along the anterior distal leg. IMPRESSION: 1. No acute fracture or dislocation. 2. Proximal pretibial soft tissue edema. Electronically signed by: Morgane Naveau MD 05/02/2024 12:36 PM EST RP Workstation: [id number]    Procedures   Medications Ordered in the ED - No data to display    Medical Decision Making Amount and/or Complexity of Data Reviewed Radiology: ordered.   This patient is a 48 year old female who presents to the ED for concern of  mechanical fall today landing on right knee after her foot got caught up in a blanket when trying to get up suddenly from couch.  Has been able to ambulate since the incident, did not lose consciousness, did not hit head.  On physical exam, patient is in no acute distress, afebrile, alert and orient x 4, speaking in full sentences, nontachypneic, nontachycardic.  Neurovascularly intact, with patient having full ROM without pain, notably does have some swelling and tenderness to anterior knee.  Has had some pain when walking.  Patient overall well-appearing.  Swelling secondary to mechanical injury with low suspicion for DVT.  X-rays negative for fracture, does show some swelling but otherwise low suspicion for any  emergent condition present at this time.  No instability noted on examination.  However we will have patient continue follow-up with orthopedic surgery, providing brace and crutches for comfort today.  Patient vital signs have remained stable throughout the course of patient's time in the ED. Low suspicion for any other emergent pathology at this time. I believe this patient is safe to be discharged. Provided strict return to ER precautions. Patient expressed agreement and understanding of plan. All questions were answered.  Differential diagnoses prior to evaluation: The emergent differential diagnosis includes, but is not limited to, fracture, ligamentous injury, neurovascular injury, dislocation, malalignment, bursitis,. This is not an exhaustive differential.   Past Medical History / Co-morbidities / Social History: GAD, PE/DVT, thyroid  cancer, GAD  Additional history: Chart reviewed. Pertinent results include:   Last PCP on 04/04/2024.  Lab Tests/Imaging studies: I personally interpreted labs/imaging and the pertinent results include:   X-ray of right knee shows no acute fracture or dislocation X-rays of tibia and fibula shows proximal pretibial edema to soft tissues.  Otherwise  unremarkable. I agree with the radiologist interpretation.   Medications:   I have reviewed the patients home medicines and have made adjustments as needed.  Critical Interventions: None  Social Determinants of Health: Has good follow-up with PCP  Disposition: After consideration of the diagnostic results and the patients response to treatment, I feel that the patient would benefit from discharge and treatment as above.   emergency department workup does not suggest an emergent condition requiring admission or immediate intervention beyond what has been performed at this time. The plan is: Follow-up with orthopedic surgery as needed, RICE, symptomatic management at home. The patient is safe for discharge and has been instructed to return immediately for worsening symptoms, change in symptoms or any other concerns.   Final diagnoses:  Pain and swelling of right knee  Fall, initial encounter    ED Discharge Orders     None          Beola Terrall GORMAN DEVONNA 05/02/24 1258    Ruthe Cornet, DO 05/02/24 1328  "

## 2024-05-02 NOTE — Discharge Instructions (Addendum)
 You are seen today for right knee swelling after a fall.  Recommend continue to ice, elevate, keep compressed and rest to help reduce swelling and pain.  You can take Tylenol  and ibuprofen  as needed for pain.  Continue any flex at ankle to help prevent clotting as with your past history noting that you are at higher risk for.  X-rays do not show any fracture today.  Would recommend you to follow-up with orthopedic surgery as needed if symptoms persist.  Take Tylenol  (acetominophen)  650mg  every 4-6 hours, as needed for pain or fever. Do not take more than 4,000 mg in a 24-hour period. As this may cause liver damage. While this is rare, if you begin to develop yellowing of the skin or eyes, stop taking and return to ER immediately.

## 2024-05-09 ENCOUNTER — Encounter: Payer: Self-pay | Admitting: Family Medicine

## 2024-05-16 ENCOUNTER — Ambulatory Visit: Admitting: Obstetrics & Gynecology

## 2024-05-31 ENCOUNTER — Encounter: Admitting: Dietician

## 2024-05-31 ENCOUNTER — Encounter: Payer: Self-pay | Admitting: Dietician

## 2024-05-31 DIAGNOSIS — E669 Obesity, unspecified: Secondary | ICD-10-CM | POA: Insufficient documentation

## 2024-05-31 NOTE — Progress Notes (Signed)
 Medical Nutrition Therapy  Appointment Start time:  [phone]  Appointment End time:  1730  Primary concerns today: Healthy Lifestyle Management   Referral diagnosis: E66.9 Preferred learning style: no preference indicated Learning readiness: ready   NUTRITION ASSESSMENT   Anthropometrics  Ht: none reported Wt: declined  Clinical Medical Hx: reviewed; HTN, HLD, anxiety, GERD, prediabetes Medications: reviewed; levothyroxine  Labs: reviewed; 04/06/24: A1c 6.0%, chol 215, LDL 136,  Notable Signs/Symptoms: none reported Food Allergies: none reported  Lifestyle & Dietary Hx  Pt states she recently broke her leg.  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   Pt states she knows what to eat and how to exercise but needs more of a plan and accountability. Pt states she gets bored with healthy eating. Pt states now is a good time to start because her son is away at college and her husband is on board to getting started. Pt reports her husband is deaf.   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   Pt reports a major barrier being that she does not want to cook a full meal just for herself and does not enjoy meal prepping. Pt reports she will eat when she is bored and wants more structure. Pt reports she eats all the time and does a lot of mindless snacking.   Pt states she used to go to the gym with her friend. Pt states she hopes to go back to the gym after her leg heals with her sister.   Pt reports she likes to read and color.   Pt reports she goes to counseling every other week.   Estimated daily fluid intake: 8-16 oz Supplements: Vitamin D Sleep: 7-8 hours. Maybe waking up 1x during the night Stress / self-care: counseling Current average weekly physical activity: ADL  24-Hr Dietary Recall First Meal: none Snack:  Second Meal: 11pm Lean cuisine pizza OR skip Snack: crackers OR pretzels  Third Meal:  fast food OR convenience foods Snack: ice cream Beverages: diet coke 4-5 cans per day, occasionally a bottle of water    NUTRITION INTERVENTION  Nutrition education (E-1) on the following topics:  Fruits & Vegetables: Aim to fill half your plate with a variety of fruits and vegetables. They are rich in vitamins, minerals, and fiber, and can help reduce the risk of chronic diseases. Choose a colorful assortment of fruits and vegetables to ensure you get a wide range of nutrients. Grains and Starches: Make at least half of your grain choices whole grains, such as brown rice, whole wheat bread, and oats. Whole grains provide fiber, which aids in digestion and healthy cholesterol levels. Aim for whole forms of starchy vegetables such as potatoes, sweet potatoes, beans, peas, and corn, which are fiber rich and provide many vitamins and minerals.  Protein: Incorporate lean sources of protein, such as poultry, fish, beans, nuts, and seeds, into your meals. Protein is essential for building and repairing tissues, staying full, balancing blood sugar, as well as supporting immune function. Dairy: Include low-fat or fat-free dairy products like milk, yogurt, and cheese in your diet. Dairy foods are excellent sources of calcium and vitamin D, which are crucial for bone health.  Physical Activity: Aim for 60 minutes of physical activity daily. Regular physical activity promotes overall health-including helping to reduce risk for heart disease and diabetes, promoting mental health, and helping us  sleep better.    Mindfulness:  Consistently scheduled meal - avoid skipping  Choices  Eat slowly  Away from distraction (sitting in kitchen or dining room)  Stop eating when satisfied  Before a snack ask, Am I hungry or eating for another reason?   What can I do instead if I am not hungry?  Try to find something every day that brings you joy!  Handouts Provided Include  Snack combinations  Learning Style &  Readiness for Change Teaching method utilized: Visual & Auditory  Demonstrated degree of understanding via: Teach Back  Barriers to learning/adherence to lifestyle change: none reported  Goals Established by Pt Drink 2-3 bottles of water per day. Drink a bottle of water first in the day and drink it in between diet cokes.  Eat 4 or more meals from home for dinner.  Food journal for the short term to recognize patterns.  Practice eating more meals at the dinner table. Focus on being more present with the meal.   MONITORING & EVALUATION Dietary intake, weekly physical activity  Next Steps  Patient is to call for questions and set appointment for 4 weeks out.

## 2024-06-13 ENCOUNTER — Ambulatory Visit: Admitting: Obstetrics and Gynecology

## 2024-06-28 ENCOUNTER — Encounter: Admitting: Dietician

## 2025-04-05 ENCOUNTER — Encounter: Admitting: Family Medicine
